# Patient Record
Sex: Male | Born: 2000 | Race: Black or African American | Marital: Single | Smoking: Never smoker
Health system: Southern US, Community
[De-identification: ages and names within clinical notes are randomized; demographics above are authoritative.]

---

## 2021-09-19 ENCOUNTER — Ambulatory Visit (INDEPENDENT_AMBULATORY_CARE_PROVIDER_SITE_OTHER): Payer: BC Managed Care – PPO | Admitting: Orthopaedic Surgery

## 2021-09-19 ENCOUNTER — Other Ambulatory Visit: Payer: Self-pay

## 2021-09-19 ENCOUNTER — Ambulatory Visit (HOSPITAL_BASED_OUTPATIENT_CLINIC_OR_DEPARTMENT_OTHER)
Admission: RE | Admit: 2021-09-19 | Discharge: 2021-09-19 | Disposition: A | Discharge status: Home / Self Care | Payer: BLUE CROSS/BLUE SHIELD | Source: Ambulatory Visit | Attending: Orthopaedic Surgery | Admitting: Orthopaedic Surgery

## 2021-09-19 ENCOUNTER — Other Ambulatory Visit (HOSPITAL_BASED_OUTPATIENT_CLINIC_OR_DEPARTMENT_OTHER): Payer: Self-pay | Admitting: Orthopaedic Surgery

## 2021-09-19 DIAGNOSIS — M25572 Pain in left ankle and joints of left foot: Secondary | ICD-10-CM | POA: Insufficient documentation

## 2021-09-19 DIAGNOSIS — M25872 Other specified joint disorders, left ankle and foot: Secondary | ICD-10-CM

## 2021-09-19 IMAGING — DX DG ANKLE COMPLETE 3+V*L*
3 series · 3 of 3 positions shown · non-contrast
Comparison: None.

CLINICAL DATA: Pain. Left ankle pain, patient reports history of
sprain.

EXAM:
LEFT ANKLE COMPLETE - 3+ VIEW

[ankle ap]
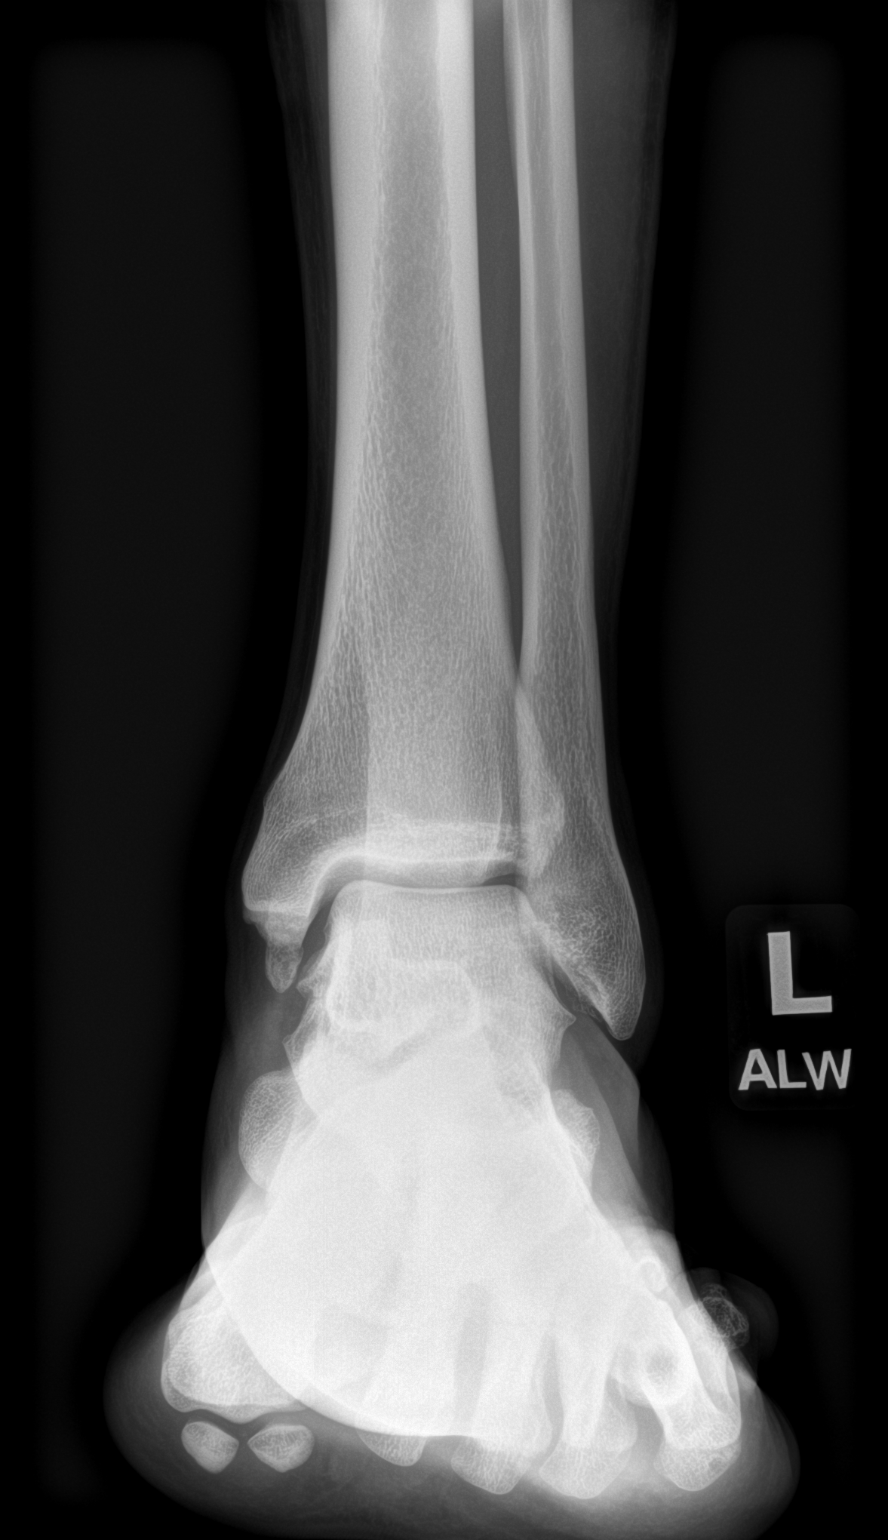

[ankle obl]
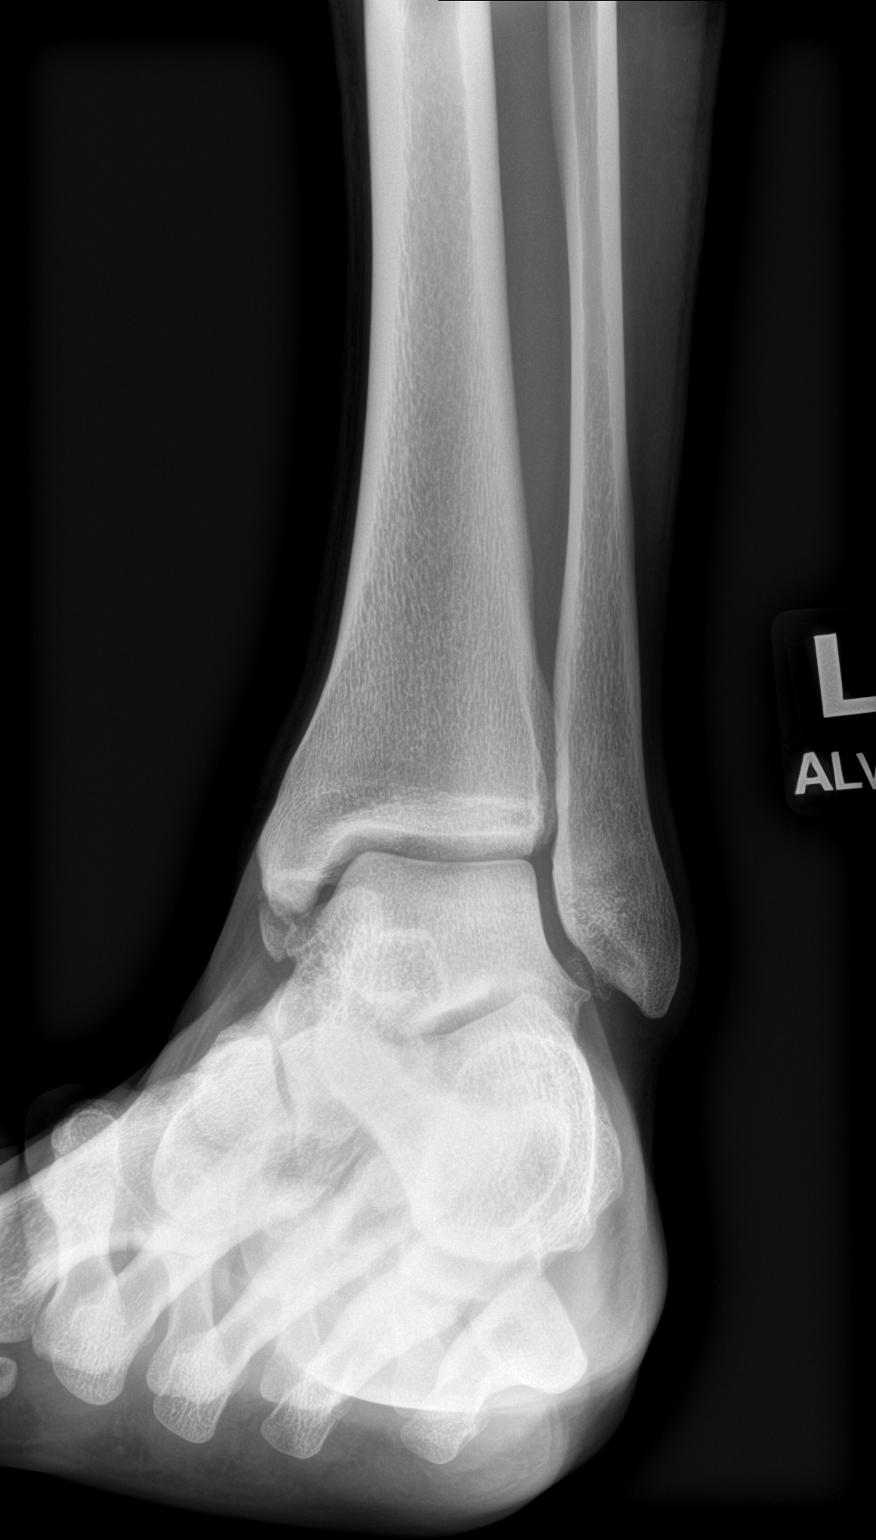

[ankle lat]
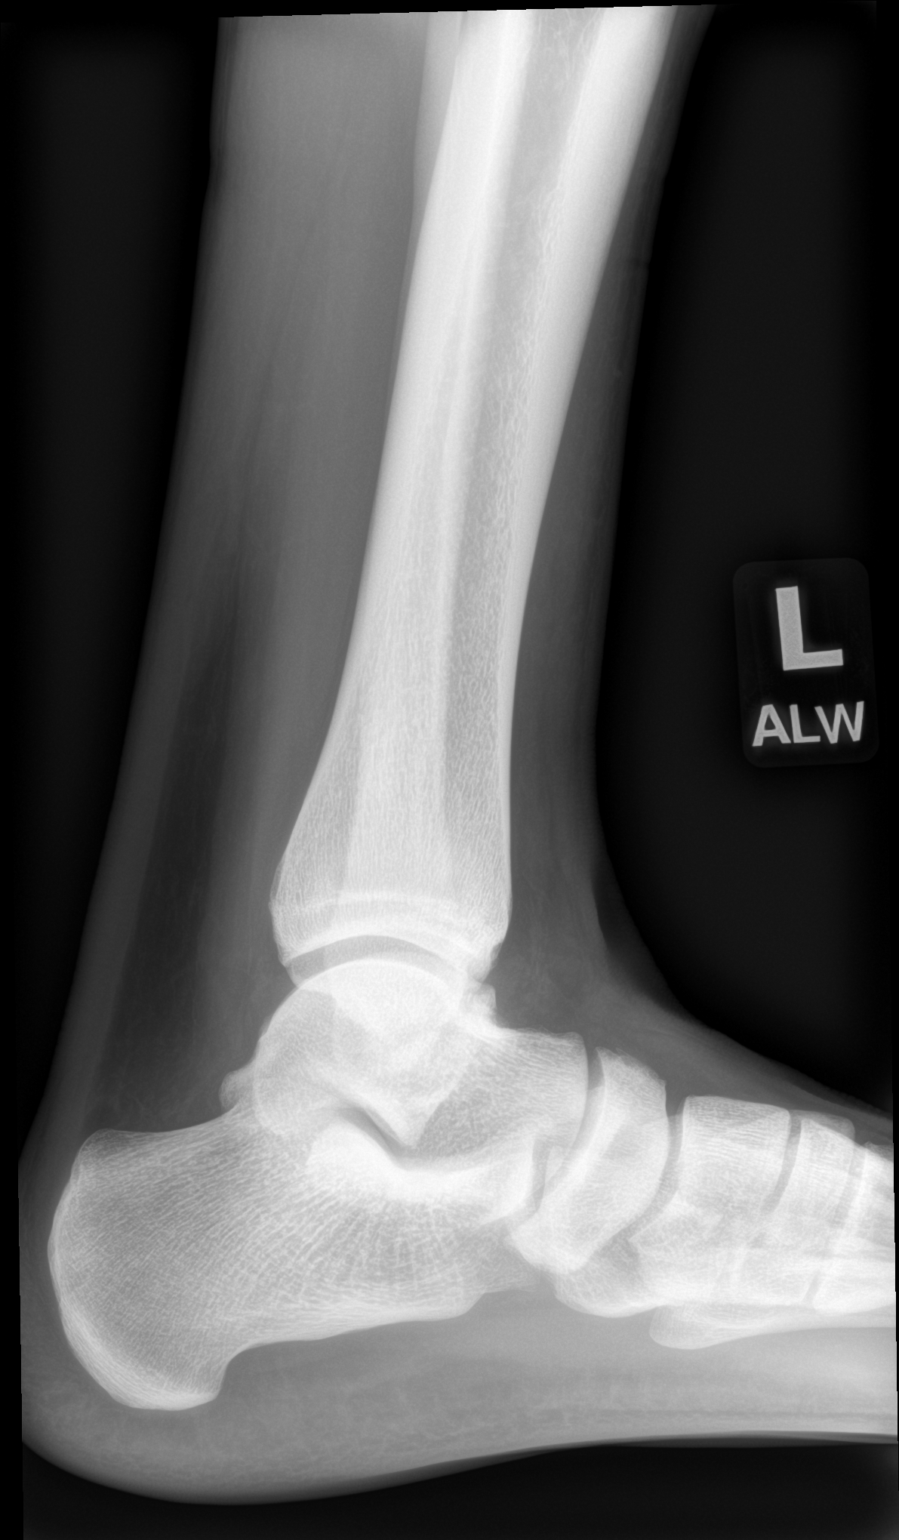

[3 of 3 positions shown; findings below may reference images not displayed]

FINDINGS: No acute fracture. Normal alignment. The ankle mortise is preserved.
There is a well corticated density distal to the medial malleolus
suggesting remote prior injury. Slight irregularity of the distal
fibular tip appears chronic and also suggest prior injury. There may
be a small ankle joint effusion. No erosion or bony destruction.
IMPRESSION: 1. Possible small ankle joint effusion.
2. Well corticated density distal to the medial malleolus and slight
irregularity of the distal fibular tip suggestive of remote prior
injuries.

## 2021-09-19 NOTE — Progress Notes (Signed)
Chief Complaint: left ankle pain     History of Present Illness:   Lee Kelley is a 20 y.o. male with left ankle pain and an inversion type injury in October 2022 during practice. 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.  Since that time he has had significant left ankle pain and significant swelling and pain with practices.  To this effect he has had somewhat limited and ineffective play time.  He has been taking ibuprofen as well as icing the ankle.  He continues to work on range of motion as well as stabilization exercises with his Event organiser.  He states that he is sprained left ankle many many times more than he can count.  Here today for further assessment and evaluation.    Surgical History:   None  PMH/PSH/Family History/Social History/Meds/Allergies:   No past medical history on file.  Social History   Socioeconomic History   Marital status: Single    Spouse name: Not on file   Number of children: Not on file   Years of education: Not on file   Highest education level: Not on file  Occupational History   Not on file  Tobacco Use   Smoking status: Not on file   Smokeless tobacco: Not on file  Substance and Sexual Activity   Alcohol use: Not on file   Drug use: Not on file   Sexual activity: Not on file  Other Topics Concern   Not on file  Social History Narrative   Not on file   Social Determinants of Health   Financial Resource Strain: Not on file  Food Insecurity: Not on file  Transportation Needs: Not on file  Physical Activity: Not on file  Stress: Not on file  Social Connections: Not on file   No family history on file. Not on File No current outpatient medications on file.   No current facility-administered medications for this visit.   No results found.  Review of Systems:   A ROS was performed including pertinent positives and negatives as documented in the HPI.  Physical  Exam :   Constitutional: NAD and appears stated age Neurological: Alert and oriented Psych: Appropriate affect and cooperative   There were no vitals taken for this visit.   Comprehensive Musculoskeletal Exam:     Right Left  Gait Normal  Musculoskeletal Exam    Deformity No deformity No deformity  Tenderness None Anterior lateral medial ankle mortise  Skin    Abrasions None None  Blisters None None  Range of Motion    Ankle Dorsiflexion 10 10 with pain  Ankle Plantarflexion 35 35  Subtalar Joint Inversion/Eversion Normal Increased laxity compared to contralateral with inversion  Stability    Dislocations None None  Subluxations or Laxity None None  Muscle Strength    Single Heel Raise Able Able  Sensation    Sural Nerve Dist. Normal Normal  Saphenous Nerve Dist. Normal Normal  Tibial Nerve Dist. Normal Normal  Deep Peroneal Nerve Dist. Normal Normal  Superficial Peroneal Nerve Dist. Normal Normal  Cardiovascular     Varicosites None  None  DP Artery Pulse Palpable Palpable  Capillary Refill <2 sec <2 sec  Special Tests:  Significant laxity with talar tilt as well as anterior drawer  Imaging:   Xray (3 views left ankle): He is got significant medial ossification consistent with possible old avulsion fracture.  Significant lateral talar impingement as well.  I personally reviewed and interpreted the radiographs.   Assessment:   20 year old male with left ankle impingement in the setting of recurrent ankle sprains.  He seems to be significantly flared up with regard to his pain after his most recent injury in October.  As result I would like to perform an ultrasound-guided left ankle steroid injection in order to see if we can get this to reacclimate to his baseline.  I would like him to take it easy for 2 days following ankle an injection in order to optimize efficacy.  Plan :    -Ultrasound-guided left ankle injection performed -I will follow-up with him in  the training room  I personally saw and evaluated the patient, and participated in the management and treatment plan.  Huel Cote, MD Attending Physician, Orthopedic Surgery  This document was dictated using Dragon voice recognition software. A reasonable attempt at proof reading has been made to minimize errors.

## 2021-09-25 ENCOUNTER — Encounter (HOSPITAL_BASED_OUTPATIENT_CLINIC_OR_DEPARTMENT_OTHER): Payer: Self-pay | Admitting: Orthopaedic Surgery

## 2021-09-25 NOTE — Progress Notes (Signed)
Chief Complaint: left ankle pain        History of Present Illness:    Lee Kelley  presents to training room today with continued left ankle pain. He got about 55% percent relief after the injection but this wore off midway through practice. Continues to have predominantly lateral gutter pain.      Assessment:   20 year old male with left ankle impingement in the setting of recurrent ankle sprains. I have created a lateral ankle post to help offload his lateral impingement. That being said, he continues to have pain and lacks ability to play significant time. I will continue to follow up and if he continues to experience impingement, we will consider arthroscopic debridement.  Surgical History:   None   PMH/PSH/Family History/Social History/Meds/Allergies:   No past medical history on file.   Social History         Socioeconomic History   Marital status: Single      Spouse name: Not on file   Number of children: Not on file   Years of education: Not on file   Highest education level: Not on file  Occupational History   Not on file  Tobacco Use   Smoking status: Not on file   Smokeless tobacco: Not on file  Substance and Sexual Activity   Alcohol use: Not on file   Drug use: Not on file   Sexual activity: Not on file  Other Topics Concern   Not on file  Social History Narrative   Not on file    Social Determinants of Health    Financial Resource Strain: Not on file  Food Insecurity: Not on file  Transportation Needs: Not on file  Physical Activity: Not on file  Stress: Not on file  Social Connections: Not on file    No family history on file. Not on File No current outpatient medications on file.    No current facility-administered medications for this visit.    Imaging Results (Last 48 hours)  No results found.     Review of Systems:   A ROS was performed including pertinent positives and negatives as documented in the HPI.   Physical Exam :    Constitutional: NAD and appears stated age Neurological: Alert and oriented Psych: Appropriate affect and cooperative    There were no vitals taken for this visit.    Comprehensive Musculoskeletal Exam:       Right Left  Gait Normal  Musculoskeletal Exam      Deformity No deformity No deformity  Tenderness None Anterior lateral medial ankle mortise  Skin      Abrasions None None  Blisters None None  Range of Motion      Ankle Dorsiflexion 10 10 with pain  Ankle Plantarflexion 35 35  Subtalar Joint Inversion/Eversion Normal Increased laxity compared to contralateral with inversion  Stability      Dislocations None None  Subluxations or Laxity None None  Muscle Strength      Single Heel Raise Able Able  Sensation      Sural Nerve Dist. Normal Normal  Saphenous Nerve Dist. Normal Normal  Tibial Nerve Dist. Normal Normal  Deep Peroneal Nerve Dist. Normal Normal  Superficial Peroneal Nerve Dist. Normal Normal  Cardiovascular       Varicosites None  None  DP Artery Pulse Palpable Palpable  Capillary Refill <2 sec <2 sec  Special Tests:  Significant laxity with talar tilt as well as anterior drawer  Imaging:   Xray (3 views left ankle): He is got significant medial ossification consistent with possible old avulsion fracture.  Significant lateral talar impingement as well.   I personally reviewed and interpreted the radiographs.

## 2021-09-26 ENCOUNTER — Other Ambulatory Visit (HOSPITAL_BASED_OUTPATIENT_CLINIC_OR_DEPARTMENT_OTHER): Payer: Self-pay | Admitting: Orthopaedic Surgery

## 2021-09-26 DIAGNOSIS — M25572 Pain in left ankle and joints of left foot: Secondary | ICD-10-CM

## 2021-10-02 ENCOUNTER — Ambulatory Visit
Admission: RE | Admit: 2021-10-02 | Discharge: 2021-10-02 | Disposition: A | Discharge status: Home / Self Care | Payer: 59 | Source: Ambulatory Visit | Attending: Orthopaedic Surgery | Admitting: Orthopaedic Surgery

## 2021-10-02 ENCOUNTER — Other Ambulatory Visit: Payer: Self-pay

## 2021-10-02 DIAGNOSIS — M25572 Pain in left ankle and joints of left foot: Secondary | ICD-10-CM

## 2021-10-02 IMAGING — MR MR ANKLE*L* W/O CM
4 of 5 series · 13 of 40 positions shown · non-contrast
Comparison: Radiographs [DATE]

CLINICAL DATA: Offended echo playing basketball 6 weeks ago.
Persistent pain.

EXAM:
MRI OF THE LEFT ANKLE WITHOUT CONTRAST
TECHNIQUE: Multiplanar, multisequence MR imaging of the ankle was performed. No
intravenous contrast was administered.

[Series 3: PD fat-sat · axial · left · 3.0mm · 0.25mm/px · z∈[-72,+32]mm · 4 of 32 slices shown]
[im 1/32]
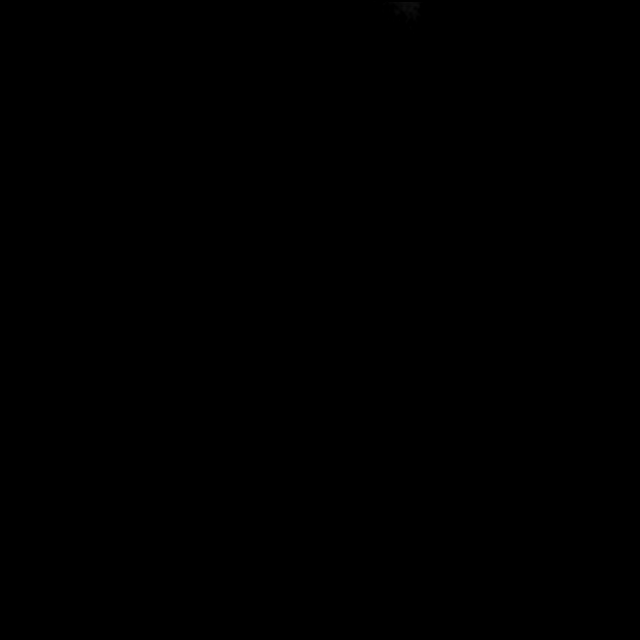
[im 5/32]
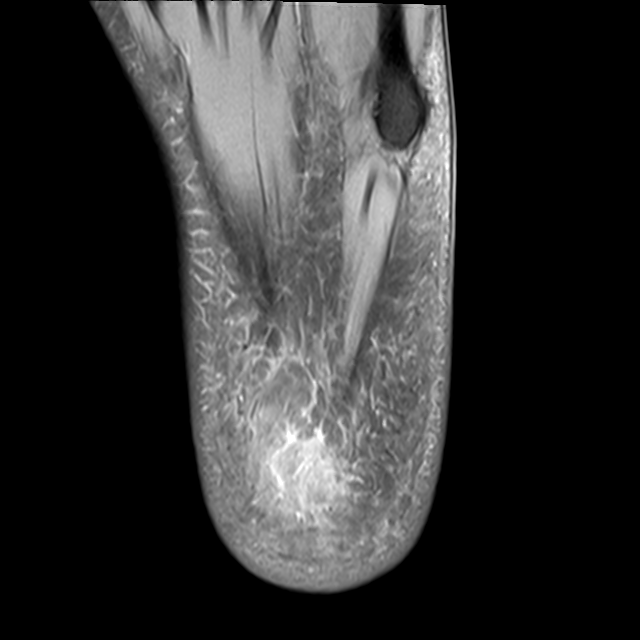
[im 18/32]
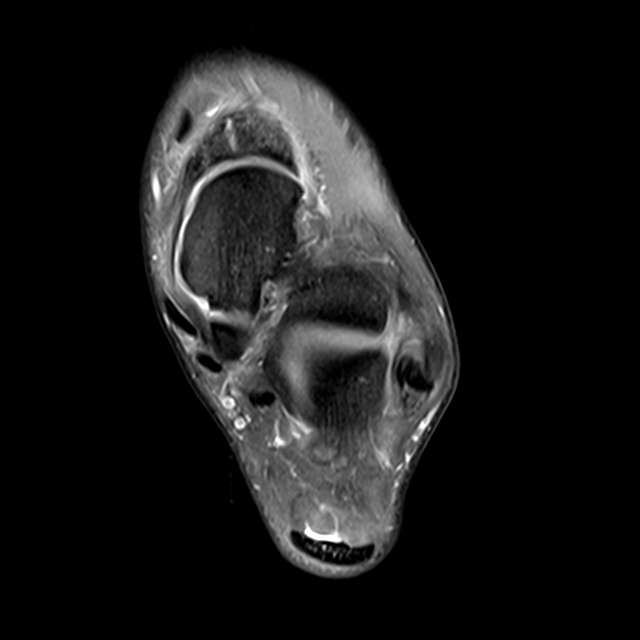
[im 27/32]
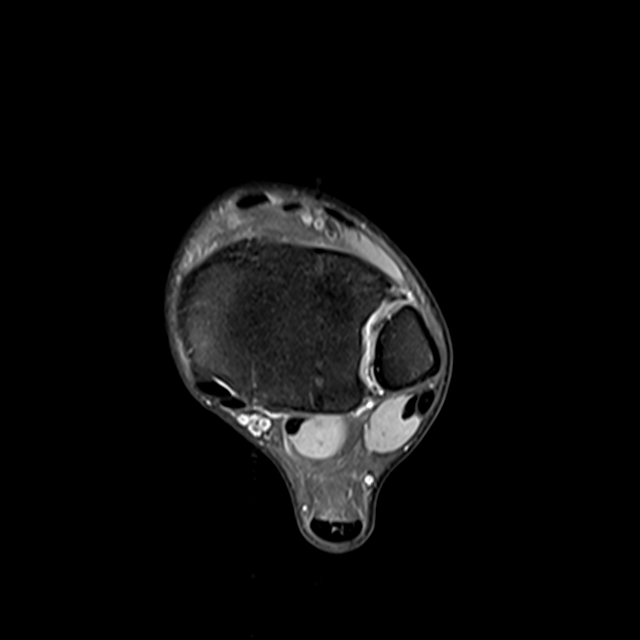

[Series 4: T2 fat-sat · axial · left · 3.0mm · 0.25mm/px · z∈[-60,+36]mm · 3 of 32 slices shown (1 of 2)]
[im 4/32]
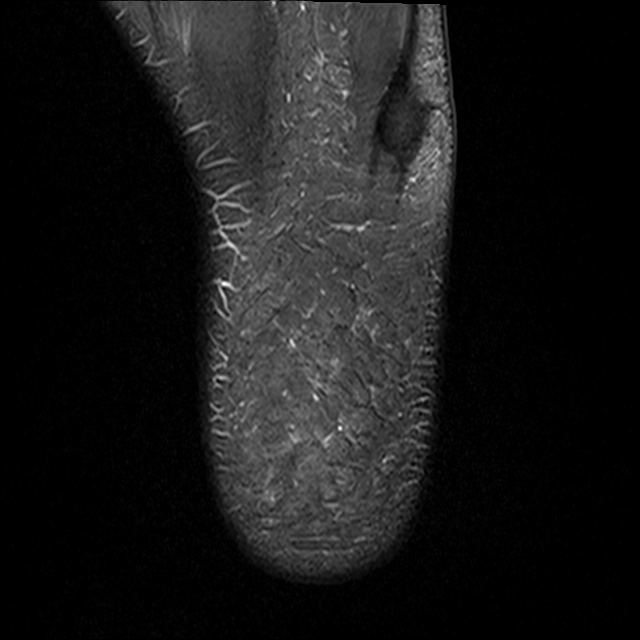
[im 16/32]
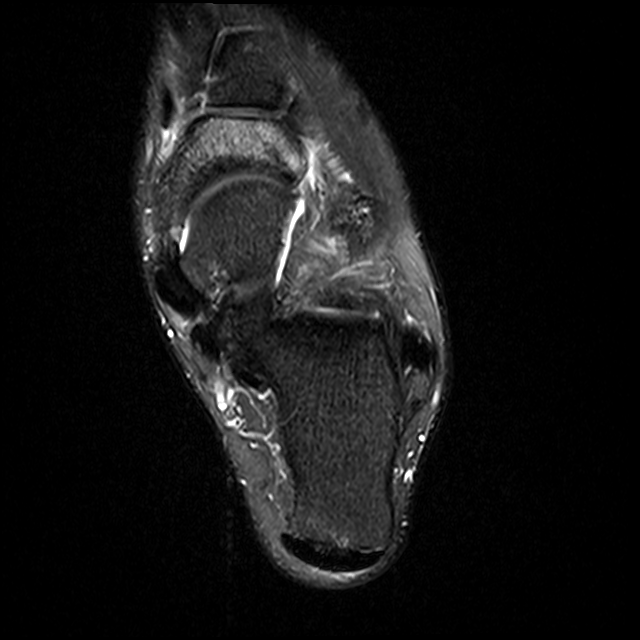
[im 28/32]
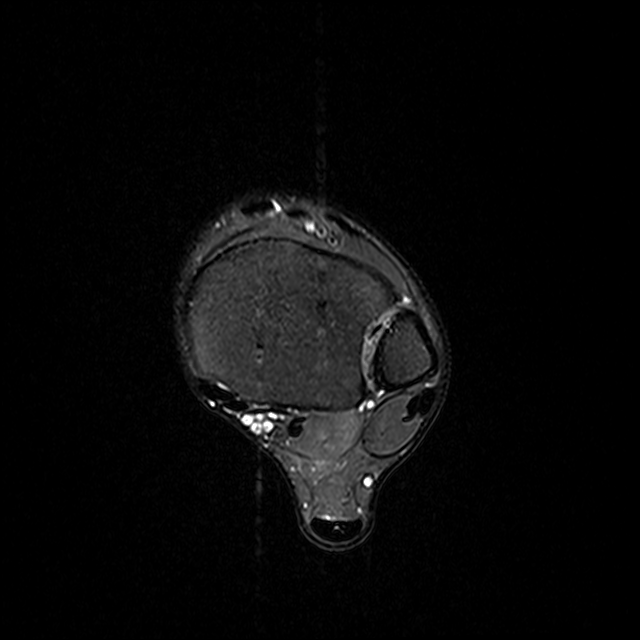

[Series 5: T1 · sagittal · left · 4.0mm · 0.27mm/px · 3 of 20 slices shown]
[im 4/20]
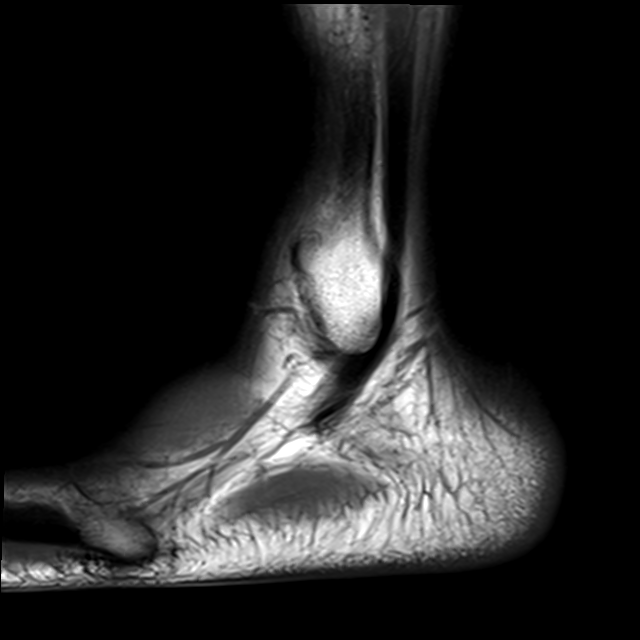
[im 12/20]
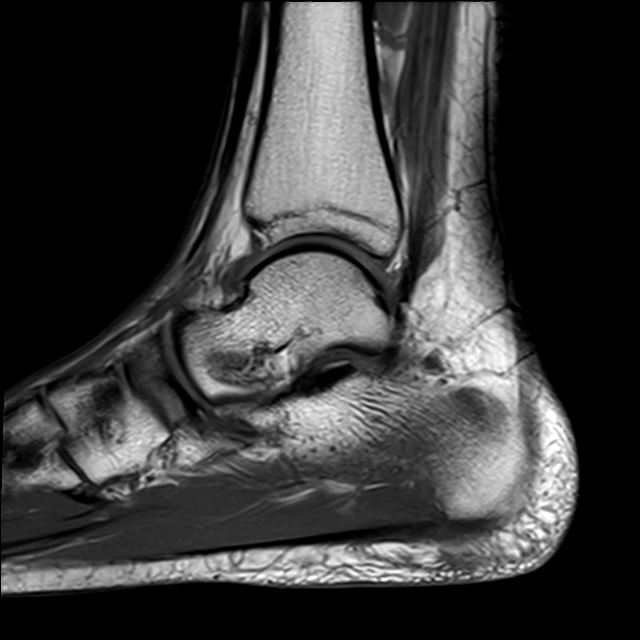
[im 20/20]
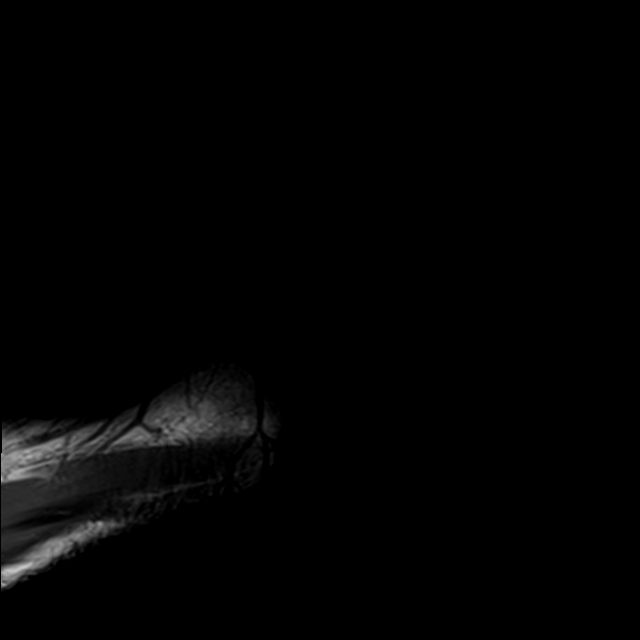

[Series 7: T2 fat-sat · coronal · left · 3.0mm · 0.25mm/px · 3 of 40 slices shown (2 of 2)]
[im 4/40]
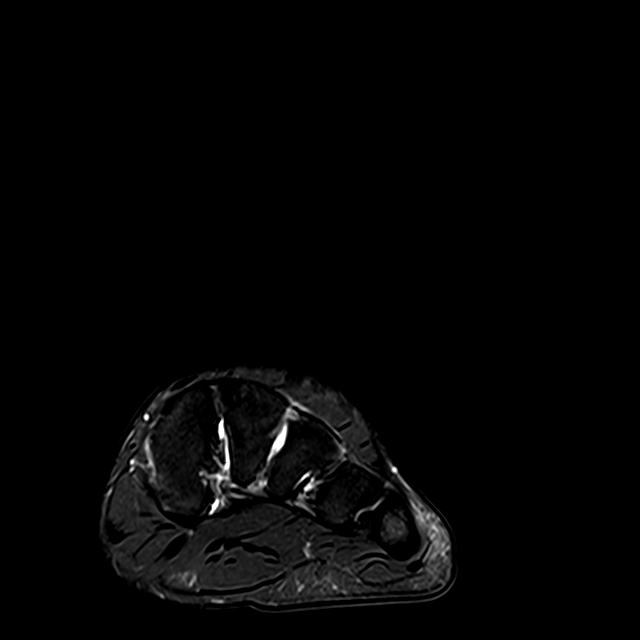
[im 20/40]
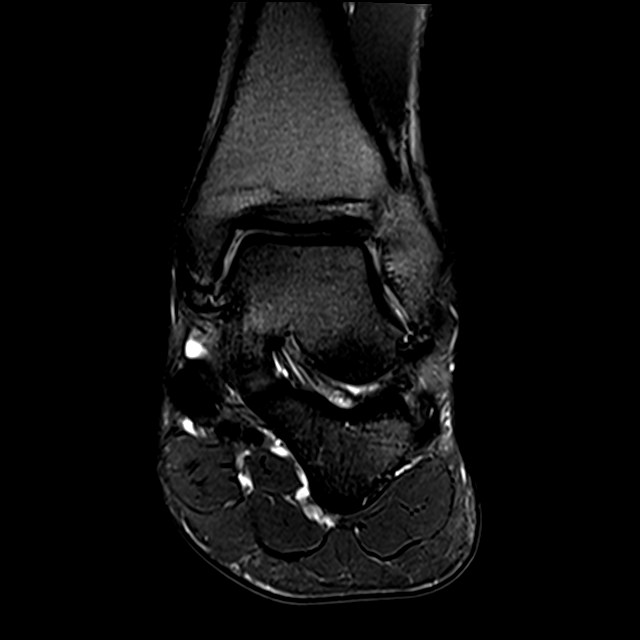
[im 36/40]
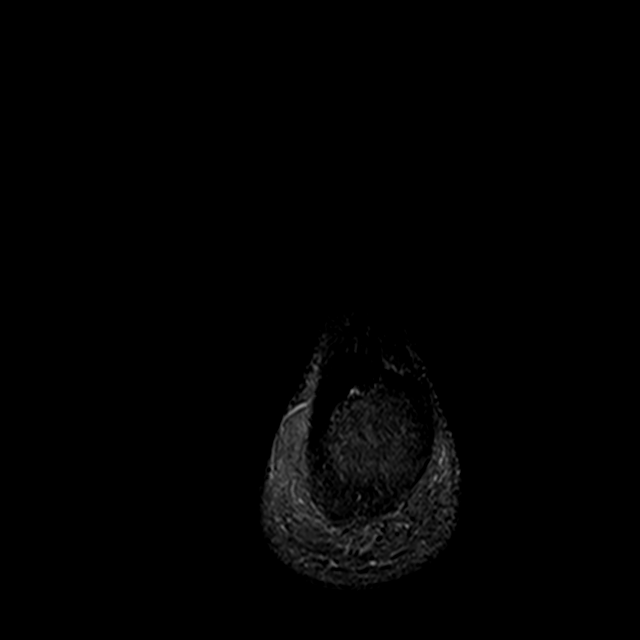

[13 of 40 positions shown; findings below may reference images not displayed]

FINDINGS: TENDONS

Peroneal: Intact

Posteromedial: Intact

Anterior: Intact

Achilles: Intact.  Mild retrocalcaneal bursitis.

Plantar Fascia: Intact

LIGAMENTS

Lateral: Intact

Medial: Intact

CARTILAGE

Ankle Joint: Normal articular cartilage. No cartilage defect or
osteochondral lesion. Small joint effusion.

Subtalar Joints/Sinus Tarsi: The subtalar joints are maintained.
Sinus tarsi is unremarkable. The cervical and interosseous ligaments
are intact. The spring ligament is intact.

Bones: There is a healing nondisplaced oblique coursing fracture of
the lateral aspect of the navicular bone. Moderate surrounding
marrow edema.

There is a remote avulsion type fracture involving the distal tip of
the medial malleolus.

Other: Unremarkable foot and ankle musculature.
IMPRESSION: 1. Healing nondisplaced oblique coursing fracture of the lateral
aspect of the navicular bone.
2. Intact medial and lateral ankle ligaments and tendons.
3. Small tibiotalar joint effusion.
4. Mild retrocalcaneal bursitis.

## 2021-10-04 ENCOUNTER — Ambulatory Visit (HOSPITAL_BASED_OUTPATIENT_CLINIC_OR_DEPARTMENT_OTHER): Payer: BC Managed Care – PPO | Admitting: Orthopaedic Surgery

## 2021-10-04 ENCOUNTER — Other Ambulatory Visit: Payer: Self-pay

## 2021-10-04 ENCOUNTER — Ambulatory Visit (INDEPENDENT_AMBULATORY_CARE_PROVIDER_SITE_OTHER): Payer: BC Managed Care – PPO | Admitting: Orthopaedic Surgery

## 2021-10-04 DIAGNOSIS — M84376A Stress fracture, unspecified foot, initial encounter for fracture: Secondary | ICD-10-CM | POA: Diagnosis not present

## 2021-10-04 NOTE — Progress Notes (Signed)
Chief Complaint: left ankle pain     History of Present Illness:   10/04/2021: Presents today with ongoing pain with weight bearing.  MRI was obtained due to his inability to return to basketball during the season which showed navicular stress fracture.  I have asked him to come in today so we can have further discussion about additional treatment.  Of note he does have a history of contralateral navicular stress injury which was treated with 6 weeks of nonweightbearing.  He returned well after this with no evolving issues.  Lee Kelley is a 20 y.o. male with left ankle pain and an inversion type injury in October 2022 during practice. 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.  Since that time he has had significant left ankle pain and significant swelling and pain with practices.  To this effect he has had somewhat limited and ineffective play time.  He has been taking ibuprofen as well as icing the ankle.  He continues to work on range of motion as well as stabilization exercises with his Event organiser.  He states that he is sprained left ankle many many times more than he can count.  Here today for further assessment and evaluation.    Surgical History:   None  PMH/PSH/Family History/Social History/Meds/Allergies:   No past medical history on file.  Social History   Socioeconomic History   Marital status: Single    Spouse name: Not on file   Number of children: Not on file   Years of education: Not on file   Highest education level: Not on file  Occupational History   Not on file  Tobacco Use   Smoking status: Not on file   Smokeless tobacco: Not on file  Substance and Sexual Activity   Alcohol use: Not on file   Drug use: Not on file   Sexual activity: Not on file  Other Topics Concern   Not on file  Social History Narrative   Not on file   Social Determinants of Health   Financial Resource Strain: Not  on file  Food Insecurity: Not on file  Transportation Needs: Not on file  Physical Activity: Not on file  Stress: Not on file  Social Connections: Not on file   No family history on file. Not on File No current outpatient medications on file.   No current facility-administered medications for this visit.   MR ANKLE LEFT WO CONTRAST  Result Date: 10/03/2021 CLINICAL DATA:  Offended echo playing basketball 6 weeks ago. Persistent pain. EXAM: MRI OF THE LEFT ANKLE WITHOUT CONTRAST TECHNIQUE: Multiplanar, multisequence MR imaging of the ankle was performed. No intravenous contrast was administered. COMPARISON:  Radiographs 09/19/2021 FINDINGS: TENDONS Peroneal: Intact Posteromedial: Intact Anterior: Intact Achilles: Intact.  Mild retrocalcaneal bursitis. Plantar Fascia: Intact LIGAMENTS Lateral: Intact Medial: Intact CARTILAGE Ankle Joint: Normal articular cartilage. No cartilage defect or osteochondral lesion. Small joint effusion. Subtalar Joints/Sinus Tarsi: The subtalar joints are maintained. Sinus tarsi is unremarkable. The cervical and interosseous ligaments are intact. The spring ligament is intact. Bones: There is a healing nondisplaced oblique coursing fracture of the lateral aspect of the navicular bone. Moderate surrounding marrow edema. There is a remote avulsion type fracture involving the distal tip of the medial malleolus. Other: Unremarkable foot and ankle musculature.  IMPRESSION: 1. Healing nondisplaced oblique coursing fracture of the lateral aspect of the navicular bone. 2. Intact medial and lateral ankle ligaments and tendons. 3. Small tibiotalar joint effusion. 4. Mild retrocalcaneal bursitis. Electronically Signed   By: Rudie Meyer M.D.   On: 10/03/2021 08:38    Review of Systems:   A ROS was performed including pertinent positives and negatives as documented in the HPI.  Physical Exam :   Constitutional: NAD and appears stated age Neurological: Alert and oriented Psych:  Appropriate affect and cooperative   There were no vitals taken for this visit.   Comprehensive Musculoskeletal Exam:     Right Left  Gait Normal  Musculoskeletal Exam    Deformity No deformity No deformity  Tenderness None Anterior lateral medial ankle mortise  Skin    Abrasions None None  Blisters None None  Range of Motion    Ankle Dorsiflexion 10 10  Ankle Plantarflexion 35 35  Subtalar Joint Inversion/Eversion Normal Increased laxity compared to contralateral with inversion  Stability    Dislocations None None  Subluxations or Laxity None None  Muscle Strength    Single Heel Raise Able Able  Sensation    Sural Nerve Dist. Normal Normal  Saphenous Nerve Dist. Normal Normal  Tibial Nerve Dist. Normal Normal  Deep Peroneal Nerve Dist. Normal Normal  Superficial Peroneal Nerve Dist. Normal Normal  Cardiovascular     Varicosites None  None  DP Artery Pulse Palpable Palpable  Capillary Refill <2 sec <2 sec  Special Tests:  Significant laxity with talar tilt as well as anterior drawer     Imaging:   Xray (3 views left ankle): He has significant medial ossification consistent with possible old avulsion fracture.  Significant lateral talar impingement as well.  MRI left ankle: There is a nondisplaced left navicular fracture with ongoing healing.  I personally reviewed and interpreted the radiographs.   Assessment:   20 year old male with left ankle impingement in the setting of recurrent ankle sprains.  MRI was obtained which shows evidence of a navicular stress fracture.  I have explained that is likely that he is experiencing more pain on the outside of the foot at this time as he is likely favoring the foot to not put stress through the navicular.  This is likely resulted in compensatory lateral based ankle pain.  I would like to keep him nonweightbearing for 4 additional weeks in a cam boot.  He will be given a boot and crutches at training room.  In order to  encourage healing I have advised that ultimately I would like to get a bone stimulator and we have contacted the rep to this effect.  We will plan to send him for blood work so that we can assess his vitamin D level and we will work closely with his sports nutritionist so as to optimize his vitamin D level.  In the meantime he will double his vitamin D supplementation. Plan :    -I will continue to follow him in training room and see him in 4 weeks for repeat CT scan in preparation for returning him back to play.  I personally saw and evaluated the patient, and participated in the management and treatment plan.  Huel Cote, MD Attending Physician, Orthopedic Surgery  This document was dictated using Dragon voice recognition software. A reasonable attempt at proof reading has been made to minimize errors.

## 2021-10-12 ENCOUNTER — Other Ambulatory Visit (HOSPITAL_BASED_OUTPATIENT_CLINIC_OR_DEPARTMENT_OTHER): Payer: Self-pay | Admitting: Orthopaedic Surgery

## 2021-10-12 MED ORDER — VITAMIN D (ERGOCALCIFEROL) 1.25 MG (50000 UNIT) PO CAPS: 50000.0000 [IU] | ORAL_CAPSULE | ORAL | 0 refills | Status: DC

## 2021-10-12 MED ORDER — CALCIUM CARBONATE 1500 (600 CA) MG PO TABS: 1500.0000 mg | ORAL_TABLET | Freq: Every day | ORAL | 0 refills | Status: DC

## 2021-11-03 ENCOUNTER — Ambulatory Visit (INDEPENDENT_AMBULATORY_CARE_PROVIDER_SITE_OTHER): Payer: 59 | Admitting: Podiatry

## 2021-11-03 ENCOUNTER — Ambulatory Visit: Payer: Self-pay

## 2021-11-03 DIAGNOSIS — S92255S Nondisplaced fracture of navicular [scaphoid] of left foot, sequela: Secondary | ICD-10-CM

## 2021-11-03 DIAGNOSIS — M216X1 Other acquired deformities of right foot: Secondary | ICD-10-CM

## 2021-11-03 DIAGNOSIS — Z87312 Personal history of (healed) stress fracture: Secondary | ICD-10-CM | POA: Diagnosis not present

## 2021-11-03 DIAGNOSIS — M216X2 Other acquired deformities of left foot: Secondary | ICD-10-CM | POA: Diagnosis not present

## 2021-11-03 DIAGNOSIS — S92254S Nondisplaced fracture of navicular [scaphoid] of right foot, sequela: Secondary | ICD-10-CM

## 2021-11-07 NOTE — Progress Notes (Signed)
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 Closed nondisplaced fracture of navicular bone of left foot, sequela  S92.255S     2. Closed nondisplaced fracture of navicular bone of right foot, sequela  S92.254S       Current or Previous Devices: Prefab devices as make-do  Foot Examination: Skin presentation:   Intact Ulcers & Callousing:   None and no history Toe / Foot Deformities:  None Weight Bearing Presentation:  Rectus Sensation:    Intact  ORTHOTIC RECOMMENDATION Recommended Device: 1x pair of custom functional foot orthotics  GOALS OF ORTHOSES - Reduce Pain - Prevent Foot Deformity - Prevent Progression of Further Foot Deformity - Relieve Pressure - Improve the Overall Biomechanical Function of the Foot and Lower Extremity.  ACTIONS PERFORMED Patient was casted for Foot Orthoses via crush box. Procedure was explained and patient tolerated procedure well. All questions were answered and concerns addressed.  PLAN Potential out of pocket cost was communicated to patient. Casts are to be sent to [facility] for fabrication. Patient is to be called for fitting when devices are ready.

## 2021-11-08 NOTE — Progress Notes (Signed)
Subjective:   Patient ID: Lee Kelley, male   DOB: 20 y.o.   MRN: [id number]   HPI 20 year old male presents the office today for orthotics.  He is accompanied today by his athletic trainer from [place].  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  He has been on the treatment of orthopedics for this and was referred to [place] for orthotics.   Review of Systems  All other systems reviewed and are negative.  No past medical history on file.     Current Outpatient Medications:    calcium carbonate (OSCAL) 1500 (600 Ca) MG TABS tablet, Take 1 tablet (1,500 mg total) by mouth daily with breakfast., Disp: 30 tablet, Rfl: 0   Vitamin D, Ergocalciferol, (DRISDOL) 1.25 MG (50000 UNIT) CAPS capsule, Take 1 capsule (50,000 Units total) by mouth every 7 (seven) days., Disp: 5 capsule, Rfl: 0  Not on File        Objective:  Physical Exam  General: AAO x3, NAD  Dermatological: Skin is warm, dry and supple bilateral.  There are no open sores, no preulcerative lesions, no rash or signs of infection present.  Vascular: Dorsalis Pedis artery and Posterior Tibial artery pedal pulses are 2/4 bilateral with immedate capillary fill time.  There is no pain with calf compression, swelling, warmth, erythema.   Neruologic: Grossly intact via light touch bilateral.   Musculoskeletal: Cannot elicit any area of pinpoint tenderness.  Decreased medial arch upon weightbearing.  Ankle, subtalar joint range of motion intact but any restrictions.  Muscular strength 5/5 in all groups tested bilateral.  Gait: Unassisted, Nonantalgic.       Assessment:   Flatfoot, history of navicular stress fractures     Plan:  He is already pending treatment with orthopedics as well as his athletic trainer for his foot issues.  He presents today for orthotics and he was measured for inserts with our orthotist, Gates Rigg for this.  I will defer other treatment to orthopedics at  this point.  If needed otherwise let us know.  We will plan on beginning therapy orthotics for his basketball shoes.  If needed we can make him an orthotic for his everyday shoes as well.  As a courtesy there is no cost for orthotics today.

## 2021-11-14 ENCOUNTER — Ambulatory Visit (INDEPENDENT_AMBULATORY_CARE_PROVIDER_SITE_OTHER): Payer: No Typology Code available for payment source | Admitting: Orthopaedic Surgery

## 2021-11-14 ENCOUNTER — Other Ambulatory Visit: Payer: Self-pay

## 2021-11-14 DIAGNOSIS — M25572 Pain in left ankle and joints of left foot: Secondary | ICD-10-CM

## 2021-11-14 DIAGNOSIS — M84376A Stress fracture, unspecified foot, initial encounter for fracture: Secondary | ICD-10-CM

## 2021-11-14 NOTE — Addendum Note (Signed)
Addended by: Kerby Less A on: 11/14/2021 04:38 PM   Modules accepted: Orders

## 2021-11-14 NOTE — Progress Notes (Signed)
Chief Complaint: left ankle pain     History of Present Illness:   11/14/2021: Presents today for follow-up of the left foot navicular stress reaction.  He has undergone a period of 5 weeks of nonweightbearing in a cam boot.  That being said when he returns to practice he did experience pain and swelling on the outside and inner part of the ankle.  Similar to prior to his.  Nonweightbearing  Lee Kelley is a 21 y.o. male with left ankle pain and an inversion type injury in October 2022 during practice. 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.  Since that time he has had significant left ankle pain and significant swelling and pain with practices.  To this effect he has had somewhat limited and ineffective play time.  He has been taking ibuprofen as well as icing the ankle.  He continues to work on range of motion as well as stabilization exercises with his Event organiser.  He states that he is sprained left ankle many many times more than he can count.  Here today for further assessment and evaluation.    Surgical History:   None  PMH/PSH/Family History/Social History/Meds/Allergies:   No past medical history on file.  Social History   Socioeconomic History   Marital status: Single    Spouse name: Not on file   Number of children: Not on file   Years of education: Not on file   Highest education level: Not on file  Occupational History   Not on file  Tobacco Use   Smoking status: Not on file   Smokeless tobacco: Not on file  Substance and Sexual Activity   Alcohol use: Not on file   Drug use: Not on file   Sexual activity: Not on file  Other Topics Concern   Not on file  Social History Narrative   Not on file   Social Determinants of Health   Financial Resource Strain: Not on file  Food Insecurity: Not on file  Transportation Needs: Not on file  Physical Activity: Not on file  Stress: Not on file  Social  Connections: Not on file   No family history on file. Not on File Current Outpatient Medications  Medication Sig Dispense Refill   calcium carbonate (OSCAL) 1500 (600 Ca) MG TABS tablet Take 1 tablet (1,500 mg total) by mouth daily with breakfast. 30 tablet 0   Vitamin D, Ergocalciferol, (DRISDOL) 1.25 MG (50000 UNIT) CAPS capsule Take 1 capsule (50,000 Units total) by mouth every 7 (seven) days. 5 capsule 0   No current facility-administered medications for this visit.   No results found.  Review of Systems:   A ROS was performed including pertinent positives and negatives as documented in the HPI.  Physical Exam :   Constitutional: NAD and appears stated age Neurological: Alert and oriented Psych: Appropriate affect and cooperative   There were no vitals taken for this visit.   Comprehensive Musculoskeletal Exam:     Right Left  Gait Normal  Musculoskeletal Exam    Deformity No deformity No deformity  Tenderness None Medial navicular  Skin    Abrasions None None  Blisters None None  Range of Motion    Ankle Dorsiflexion 10 10  Ankle Plantarflexion 35 35  Subtalar Joint Inversion/Eversion  Normal Increased laxity compared to contralateral with inversion  Stability    Dislocations None None  Subluxations or Laxity None None  Muscle Strength    Single Heel Raise Able Able  Sensation    Sural Nerve Dist. Normal Normal  Saphenous Nerve Dist. Normal Normal  Tibial Nerve Dist. Normal Normal  Deep Peroneal Nerve Dist. Normal Normal  Superficial Peroneal Nerve Dist. Normal Normal  Cardiovascular     Varicosites None  None  DP Artery Pulse Palpable Palpable  Capillary Refill <2 sec <2 sec  Special Tests:  Significant laxity with talar tilt as well as anterior drawer     Imaging:   Xray (3 views left ankle): He has significant medial ossification consistent with possible old avulsion fracture.  Significant lateral talar impingement as well.  MRI left ankle: There  is a nondisplaced left navicular fracture with ongoing healing.  I personally reviewed and interpreted the radiographs.   Assessment:   21 year old male with left ankle impingement in the setting of recurrent ankle sprains.  MRI was obtained which shows evidence of a navicular stress fracture.  He has been undergoing nonweightbearing as well as vitamin D, calcium supplementation as well as a bone stimulator.  He has returned to basketball this time at practice but is only been able to participate in 65 to 70% level.  As result I discussed that I would like him to undergo an additional 2 weeks of nonweightbearing in the cam boot.  I will plan to order an MRI so we can assess for healing of his navicular stress reaction.  We did also discussed the implications of his lateral ankle instability and impingement.  This continues to bother him.  I described that even with navicular healing is unlikely that this pain on the outside of the ankle fully improved.  I did discuss the role for possibly undergoing ankle debridement as well as Brostrm repair although he would like to try to return for the season pending healing on the MRI Plan :    -Plan for MRI of left foot and ankle to look for navicular healing  I personally saw and evaluated the patient, and participated in the management and treatment plan.  Lee Cote, MD Attending Physician, Orthopedic Surgery  This document was dictated using Dragon voice recognition software. A reasonable attempt at proof reading has been made to minimize errors.

## 2021-11-23 ENCOUNTER — Other Ambulatory Visit: Payer: Self-pay

## 2021-11-23 ENCOUNTER — Ambulatory Visit
Admission: RE | Admit: 2021-11-23 | Discharge: 2021-11-23 | Disposition: A | Discharge status: Home / Self Care | Payer: 59 | Source: Ambulatory Visit | Attending: Orthopaedic Surgery | Admitting: Orthopaedic Surgery

## 2021-11-23 ENCOUNTER — Ambulatory Visit
Admission: RE | Admit: 2021-11-23 | Discharge: 2021-11-23 | Disposition: A | Discharge status: Home / Self Care | Payer: No Typology Code available for payment source | Source: Ambulatory Visit | Attending: Orthopaedic Surgery | Admitting: Orthopaedic Surgery

## 2021-11-23 DIAGNOSIS — M84376A Stress fracture, unspecified foot, initial encounter for fracture: Secondary | ICD-10-CM

## 2021-11-23 IMAGING — MR MR FOOT*L* W/O CM
5 series · 40 of 40 positions shown · non-contrast
Comparison: None.

CLINICAL DATA: Foot pain, stress fracture suspected. Negative x-ray

EXAM:
MRI OF THE LEFT FOOT WITHOUT CONTRAST
TECHNIQUE: Multiplanar, multisequence MR imaging of the left was performed. No
intravenous contrast was administered.

[Series 5: T1 · coronal · left · 3.0mm · 0.31mm/px · 12 of 46 slices shown (1 of 2)]
[im 1/46]
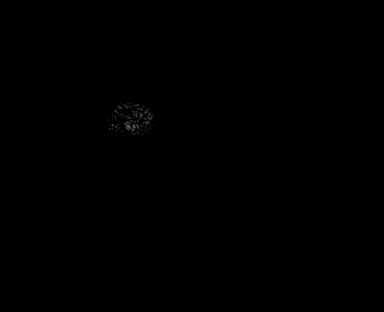
[im 5/46]
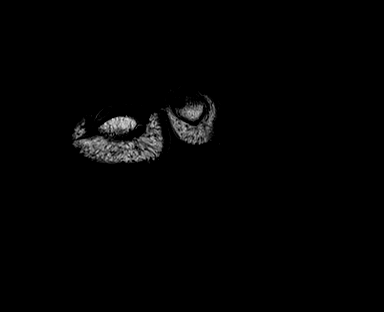
[im 9/46]
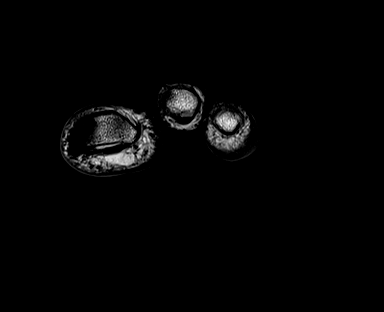
[im 13/46]
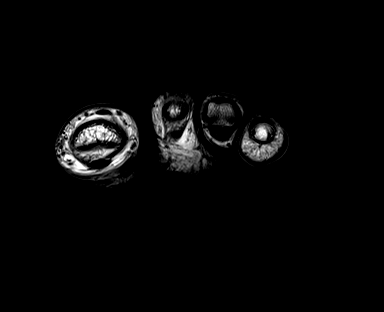
[im 17/46]
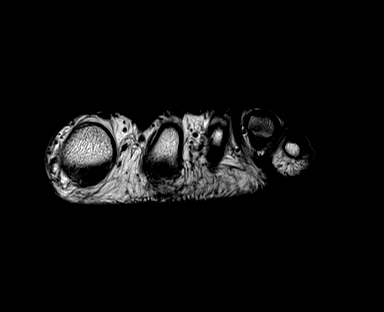
[im 21/46]
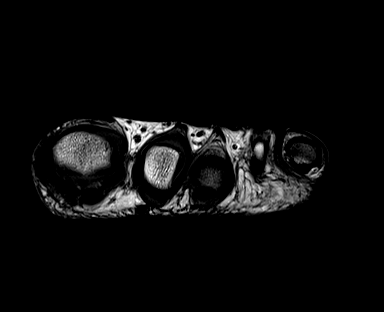
[im 25/46]
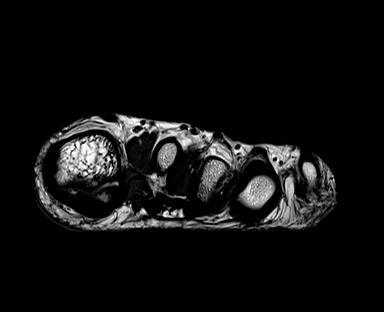
[im 29/46]
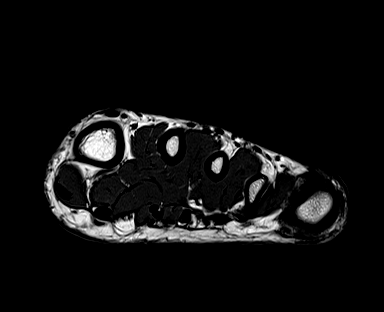
[im 33/46]
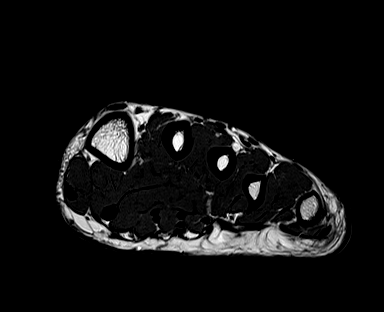
[im 37/46]
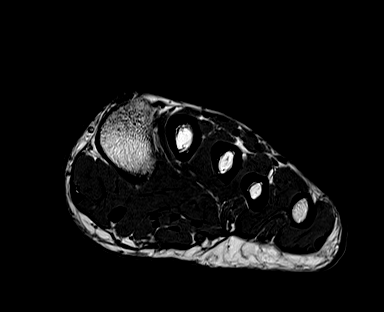
[im 41/46]
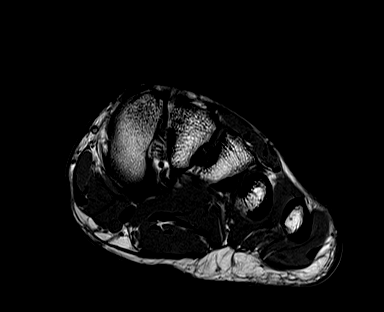
[im 46/46]
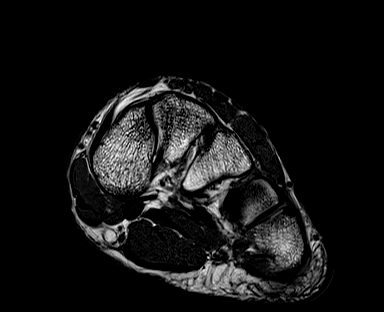

[Series 6: T2 fat-sat · coronal · left · 3.0mm · 0.31mm/px · 11 of 46 slices shown (1 of 2)]
[im 1/46]
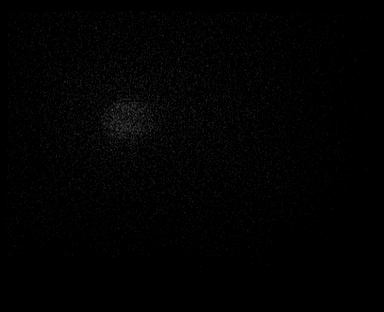
[im 5/46]
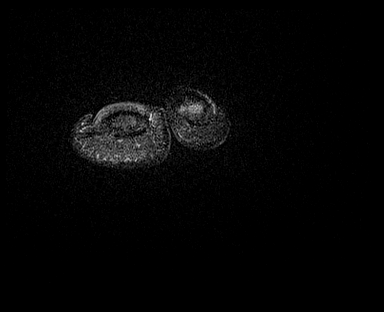
[im 10/46]
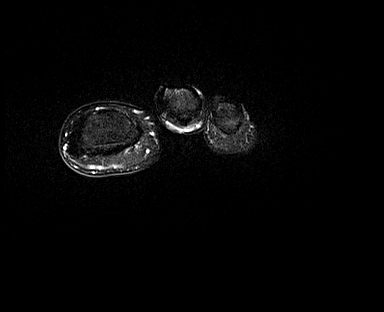
[im 14/46]
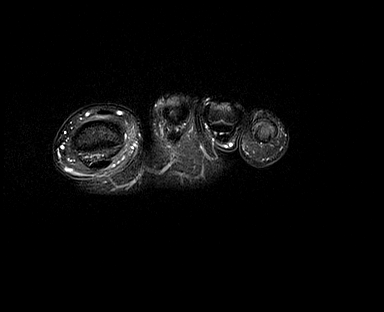
[im 19/46]
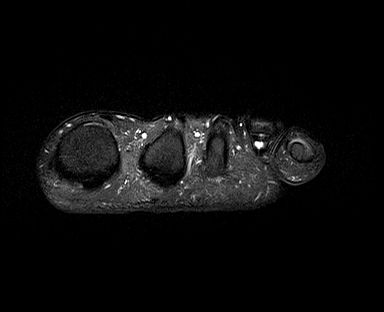
[im 23/46]
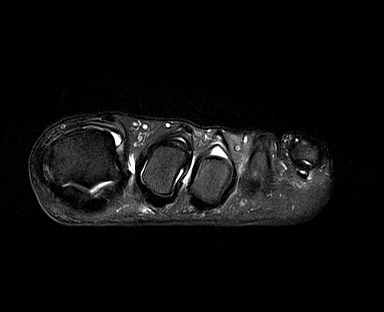
[im 28/46]
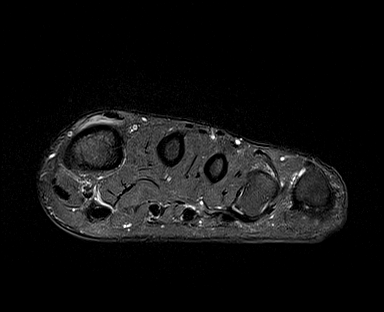
[im 32/46]
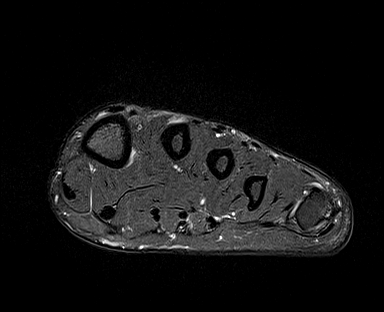
[im 37/46]
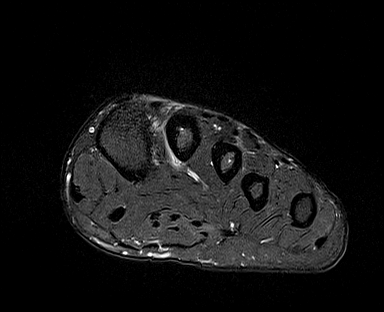
[im 41/46]
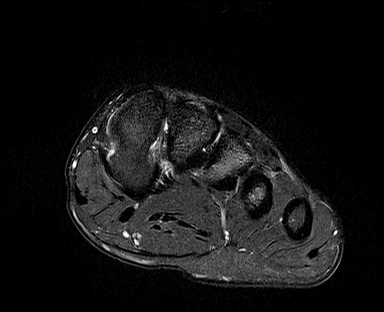
[im 46/46]
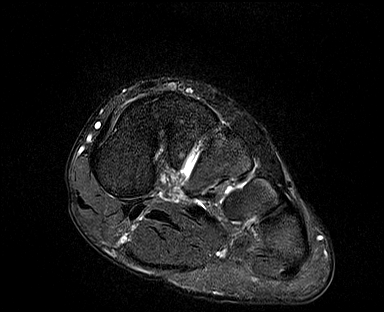

[Series 7: STIR · sagittal · left · 3.0mm · 0.70mm/px · 7 of 31 slices shown]
[im 1/31]
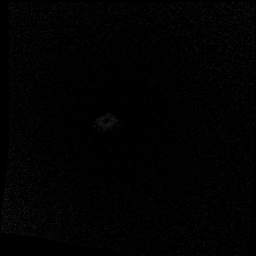
[im 6/31]
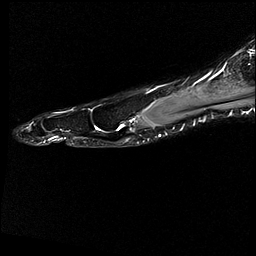
[im 11/31]
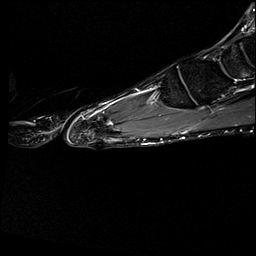
[im 16/31]
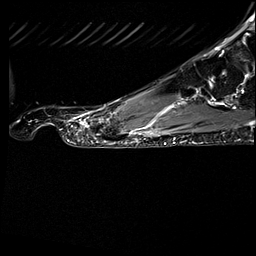
[im 21/31]
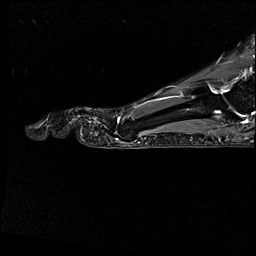
[im 26/31]
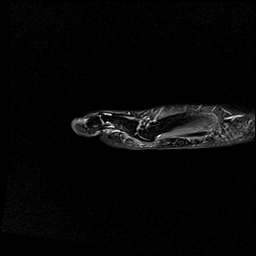
[im 31/31]
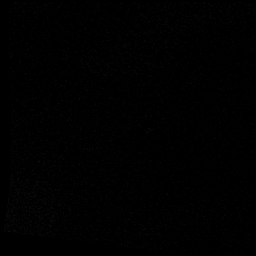

[Series 10: T1 · axial · left · 3.0mm · 0.49mm/px · z∈[-141,-59]mm · 5 of 23 slices shown (2 of 2)]
[im 1/23]
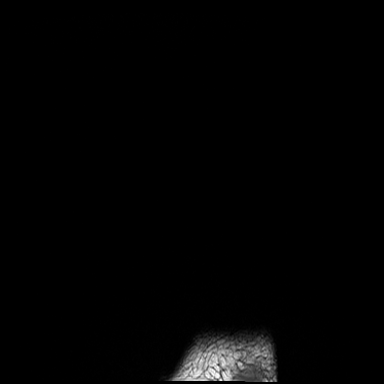
[im 6/23]
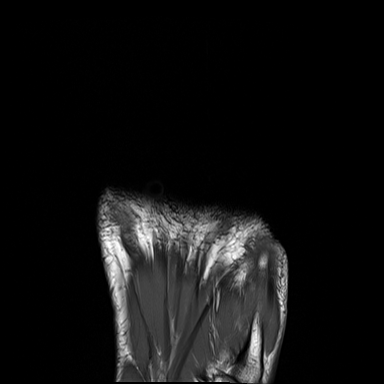
[im 12/23]
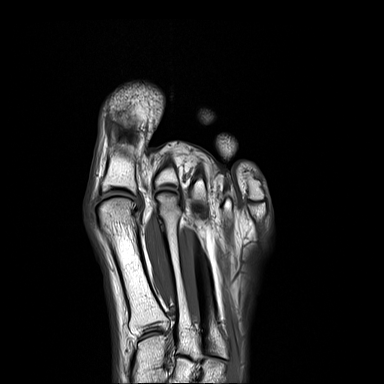
[im 17/23]
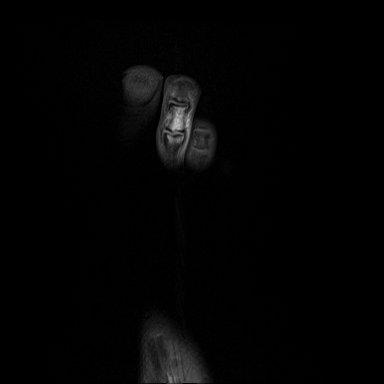
[im 23/23]
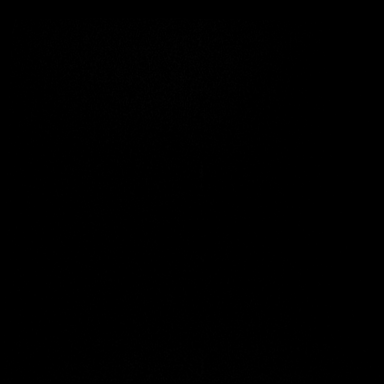

[Series 11: T2 fat-sat · axial · left · 3.0mm · 0.49mm/px · z∈[-141,-59]mm · 5 of 23 slices shown (2 of 2)]
[im 1/23]
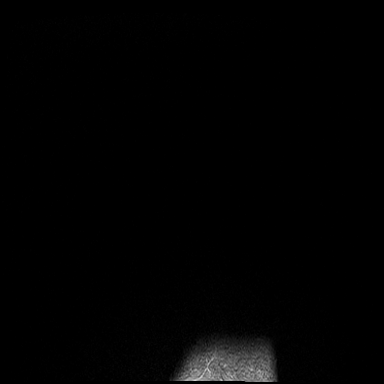
[im 6/23]
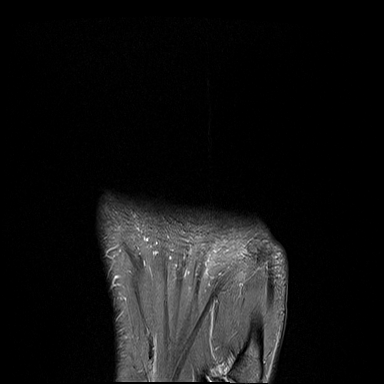
[im 12/23]
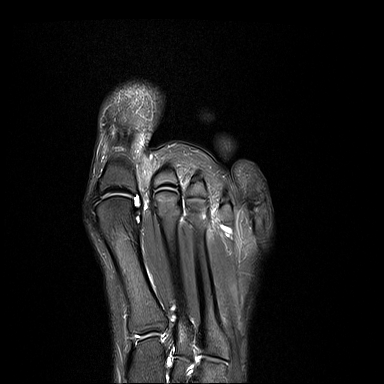
[im 17/23]
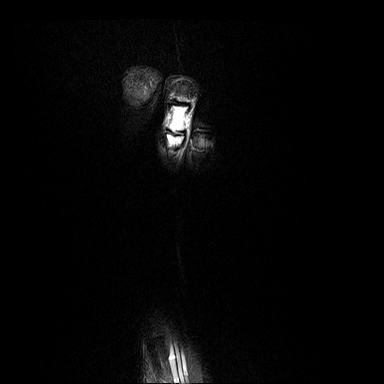
[im 23/23]
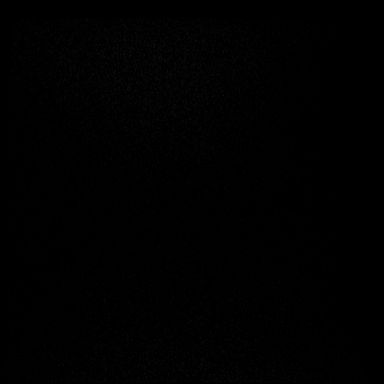

[40 of 40 positions shown; findings below may reference images not displayed]

FINDINGS: Bones/Joint/Cartilage

No fracture or dislocation. Normal alignment. No joint effusion. No
marrow signal abnormality.

Ligaments

Collateral ligaments are intact.  Lisfranc ligament is intact.

Muscles and Tendons

Flexor, peroneal and extensor compartment tendons are intact.
Muscles are normal.

Soft tissue
No fluid collection or hematoma.  No soft tissue mass.
IMPRESSION: Normal MRI examination without evidence of acute osseous, or
ligament/tendinous injury.

## 2021-11-29 ENCOUNTER — Telehealth: Payer: Self-pay | Admitting: Podiatry

## 2021-11-29 NOTE — Telephone Encounter (Signed)
Orthotics in.. lvm for pt to call to schedule an appt to pick them up. °

## 2022-01-26 ENCOUNTER — Ambulatory Visit (HOSPITAL_BASED_OUTPATIENT_CLINIC_OR_DEPARTMENT_OTHER): Payer: Self-pay | Admitting: Orthopaedic Surgery

## 2022-01-26 ENCOUNTER — Ambulatory Visit (INDEPENDENT_AMBULATORY_CARE_PROVIDER_SITE_OTHER): Payer: No Typology Code available for payment source | Admitting: Orthopaedic Surgery

## 2022-01-26 ENCOUNTER — Other Ambulatory Visit (HOSPITAL_BASED_OUTPATIENT_CLINIC_OR_DEPARTMENT_OTHER): Payer: Self-pay

## 2022-01-26 ENCOUNTER — Other Ambulatory Visit: Payer: Self-pay

## 2022-01-26 ENCOUNTER — Ambulatory Visit (HOSPITAL_BASED_OUTPATIENT_CLINIC_OR_DEPARTMENT_OTHER)
Admission: RE | Admit: 2022-01-26 | Discharge: 2022-01-26 | Disposition: A | Discharge status: Home / Self Care | Payer: No Typology Code available for payment source | Source: Ambulatory Visit | Attending: Orthopaedic Surgery | Admitting: Orthopaedic Surgery

## 2022-01-26 DIAGNOSIS — M25571 Pain in right ankle and joints of right foot: Secondary | ICD-10-CM | POA: Insufficient documentation

## 2022-01-26 IMAGING — DX DG ANKLE COMPLETE 3+V*R*
3 series · 3 of 3 positions shown · non-contrast
Comparison: None.

CLINICAL DATA: Medial right ankle pain for 2 weeks.

EXAM:
RIGHT ANKLE - COMPLETE 3+ VIEW

[ankle ap]
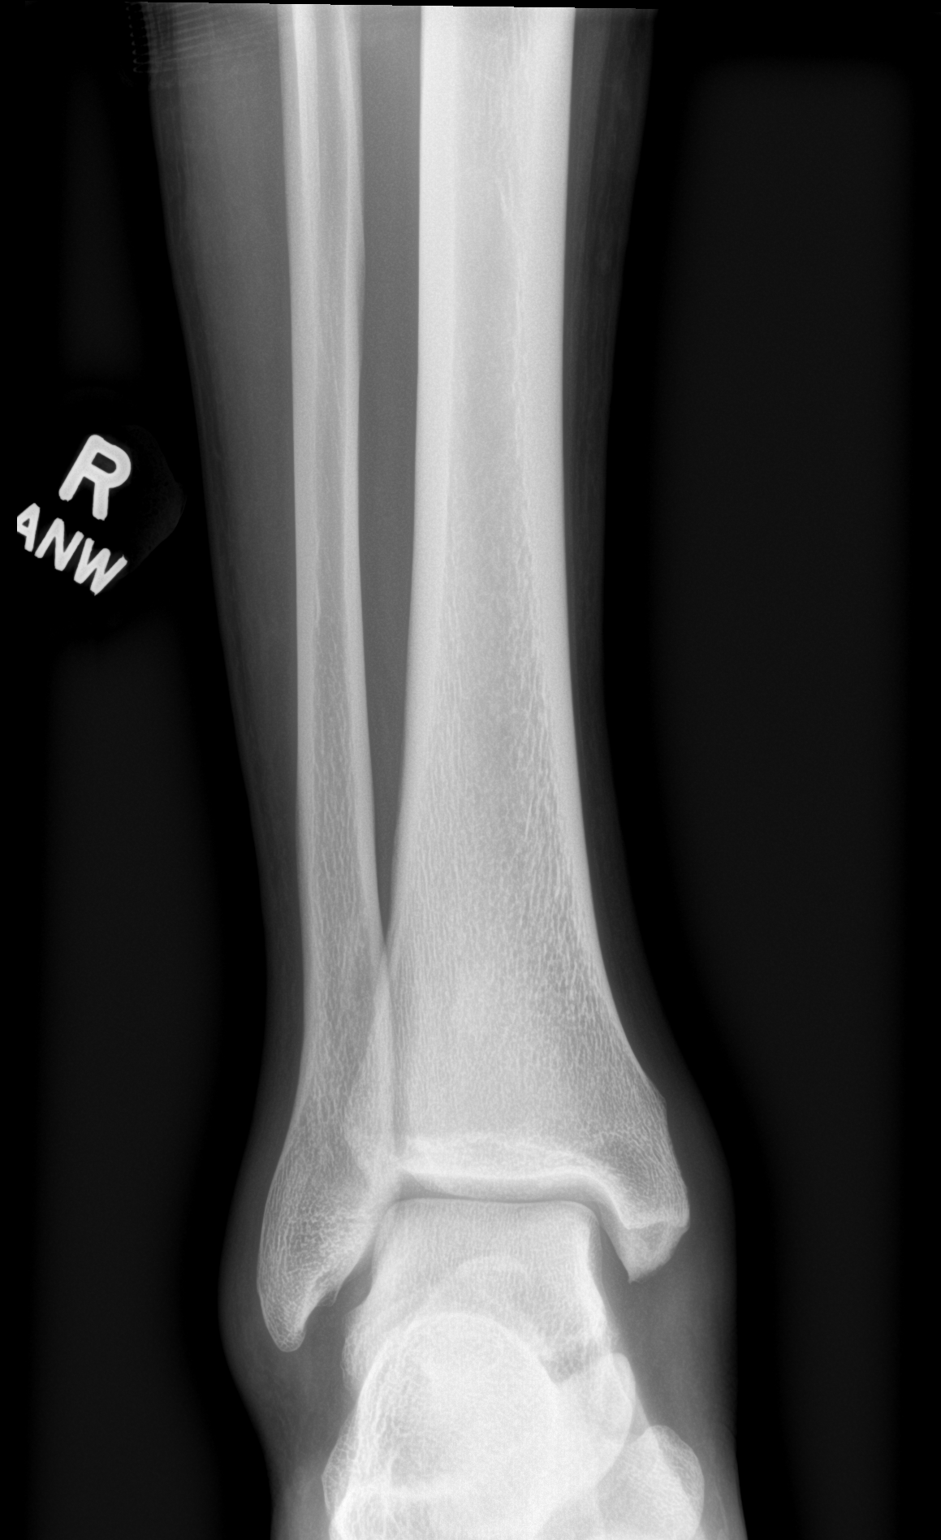

[ankle obl]
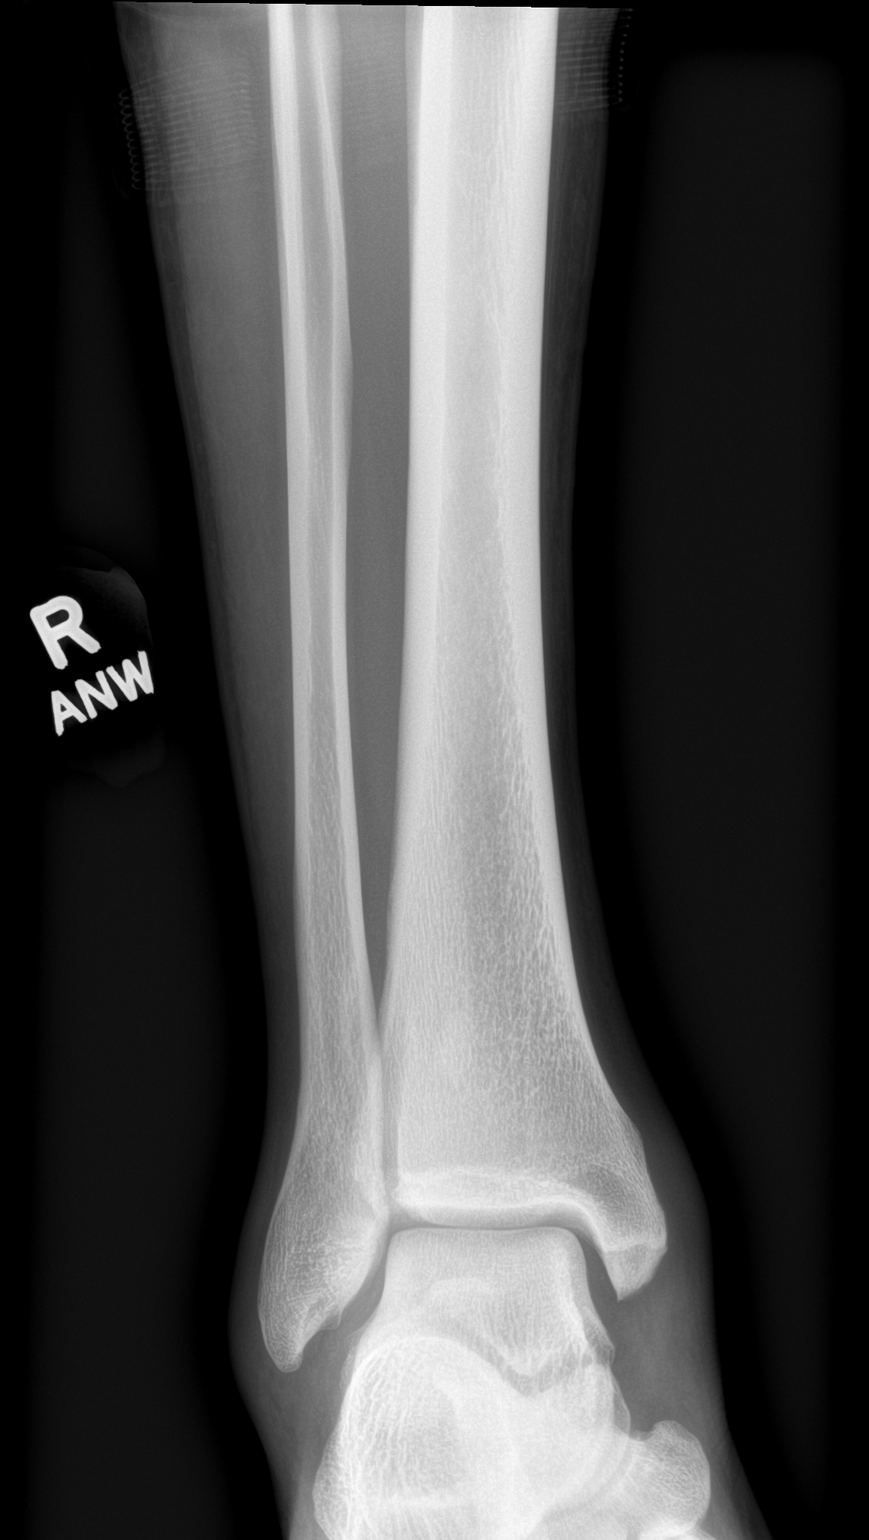

[ankle lat]
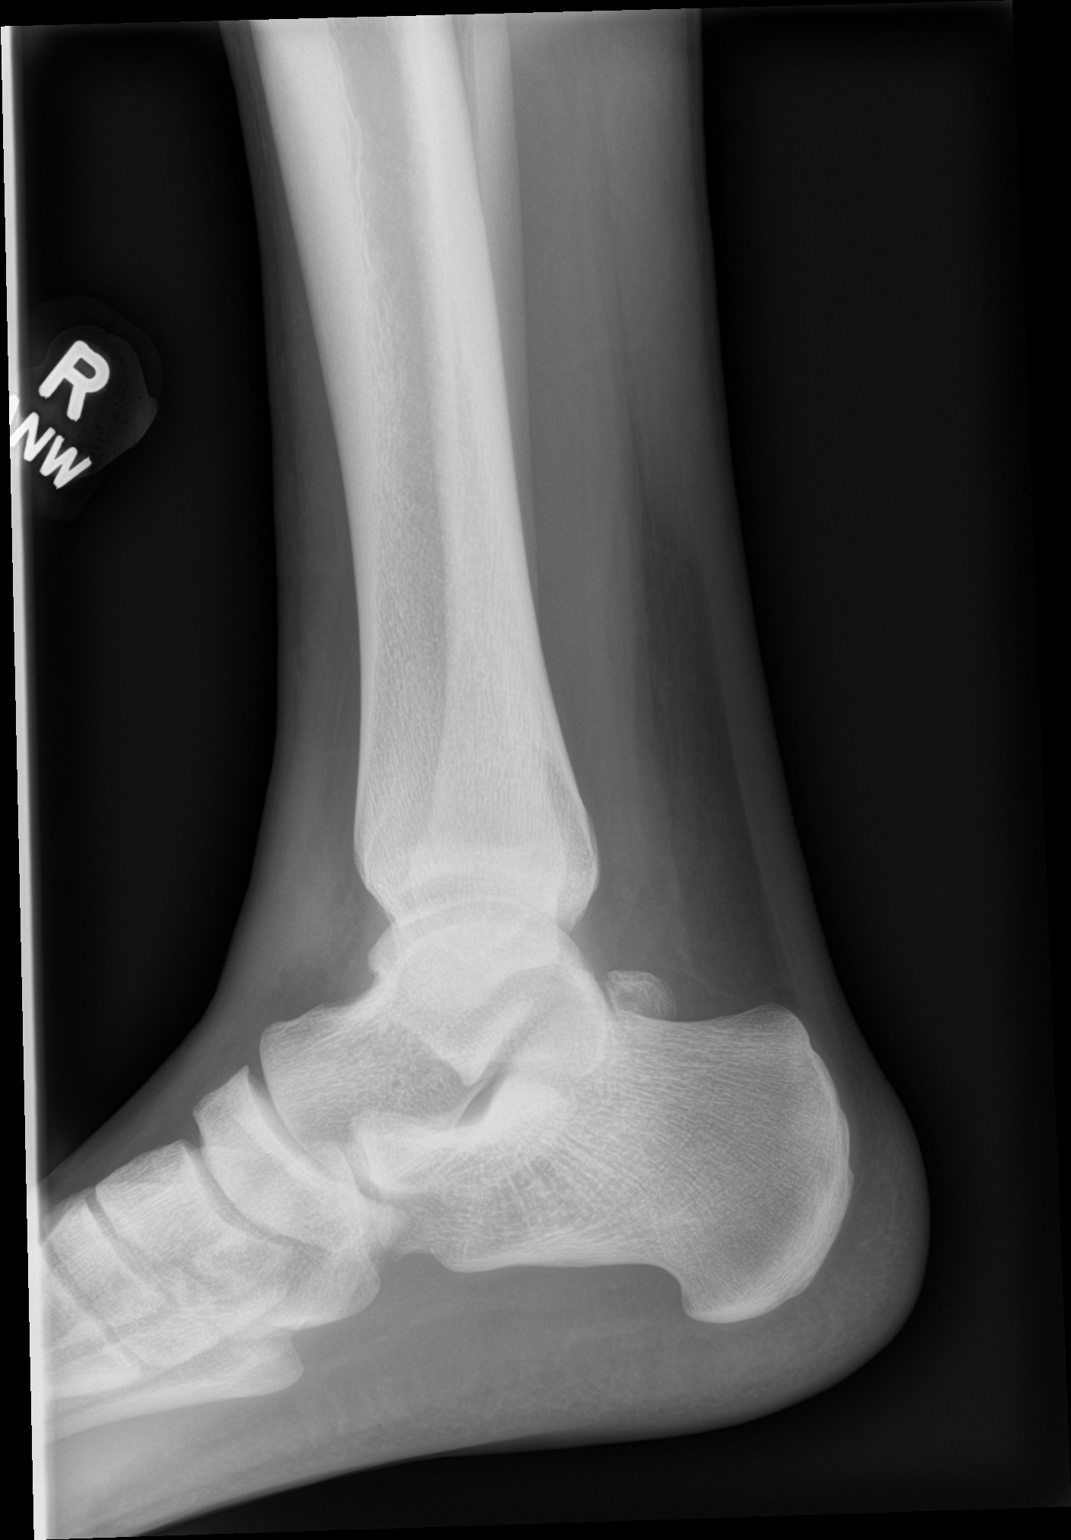

[3 of 3 positions shown; findings below may reference images not displayed]

FINDINGS: The ankle mortise is symmetric and intact. Mild diffuse medial,
lateral, and anterior ankle soft tissue swelling. Minimal distal
medial malleolar degenerative spurring. No acute fracture or
dislocation. Joint spaces are preserved.
IMPRESSION: Mild diffuse ankle soft tissue swelling.

Minimal distal medial malleolar degenerative spurring.

## 2022-01-26 MED ORDER — ASPIRIN EC 325 MG PO TBEC
325.0000 mg | DELAYED_RELEASE_TABLET | Freq: Every day | ORAL | 0 refills | Status: AC
  Filled 2022-01-26: qty 30, 30d supply, fill #0

## 2022-01-26 MED ORDER — ACETAMINOPHEN 500 MG PO TABS
500.0000 mg | ORAL_TABLET | Freq: Three times a day (TID) | ORAL | 0 refills | Status: AC
  Filled 2022-01-26: qty 30, 10d supply, fill #0

## 2022-01-26 MED ORDER — IBUPROFEN 800 MG PO TABS
800.0000 mg | ORAL_TABLET | Freq: Three times a day (TID) | ORAL | 0 refills | Status: AC
  Filled 2022-01-26: qty 30, 10d supply, fill #0

## 2022-01-26 MED ORDER — OXYCODONE HCL 5 MG PO TABS
5.0000 mg | ORAL_TABLET | ORAL | 0 refills | Status: DC | PRN
  Filled 2022-01-26: qty 10, 2d supply, fill #0

## 2022-01-26 NOTE — Progress Notes (Signed)
? ?                            ? ? ?Chief Complaint: Right ankle pain ?  ? ? ?History of Present Illness:  ? ?01/26/2022: Presents today for ongoing issues with bilateral ankles.  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  Unfortunately he did have a right ankle sprain 1 month prior prior to a game at which point he was very limited and not able to bear full weight on the ankle.  With regard to the right ankle he states that he continues to feel unstable.  This ankle has been significantly bothersome for many years.  He states that he has sprained his ankle at this time an uncountable number of times.  He is a current every 1 to 2 months at this rate.  He time this happens his ankle becomes swollen limits his ability to play.  He has worked extensively with his Product/process development scientist for peroneal strengthening and strengthening program about the ankle.  He has used multiple over-the-counter ankle braces which do not seem to avoid to prevent ankle sprains.  He is utilize taping during games although this has again had limited effectiveness.  Presents today with ongoing right ankle pain.  States that the ankle does feel like it is unstable and going to give out at this rate even with very basic activities like covering activities at the [facility] [employer] [facility] [employer], a new job that he is obtained. ? ?Lee Kelley is a 21 y.o. male with left ankle pain and an inversion type injury in October 2022 during practice. 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  Since that time he has had significant left ankle pain and significant swelling and pain with practices.  To this effect he has had somewhat limited and ineffective play time.  He has been taking ibuprofen as well as icing the ankle.  He continues to work on range of motion as well as stabilization exercises with his Product/process development scientist.  He states that he is sprained left ankle many many times more than he can count.  Here today for further assessment and  evaluation. ? ? ? ?Surgical History:   ?None ? ?PMH/PSH/Family History/Social History/Meds/Allergies:   ?No past medical history on file. ? ?Social History  ? ?Socioeconomic History  ? Marital status: Single  ?  Spouse name: Not on file  ? Number of children: Not on file  ? Years of education: Not on file  ? Highest education level: Not on file  ?Occupational History  ? Not on file  ?Tobacco Use  ? Smoking status: Not on file  ? Smokeless tobacco: Not on file  ?Substance and Sexual Activity  ? Alcohol use: Not on file  ? Drug use: Not on file  ? Sexual activity: Not on file  ?Other Topics Concern  ? Not on file  ?Social History Narrative  ? Not on file  ? ?Social Determinants of Health  ? ?Financial Resource Strain: Not on file  ?Food Insecurity: Not on file  ?Transportation Needs: Not on file  ?Physical Activity: Not on file  ?Stress: Not on file  ?Social Connections: Not on file  ? ?No family history on file. ?No Known Allergies ?Current Outpatient Medications  ?Medication Sig Dispense Refill  ? acetaminophen (TYLENOL) 500 MG tablet Take 1 tablet (500 mg total) by mouth every 8 (eight) hours for 10  days. 30 tablet 0  ? aspirin EC 325 MG tablet Take 1 tablet (325 mg total) by mouth daily. 30 tablet 0  ? ibuprofen (ADVIL) 800 MG tablet Take 1 tablet (800 mg total) by mouth every 8 (eight) hours for 10 days. Please take with food, please alternate with acetaminophen 30 tablet 0  ? oxyCODONE (OXY IR/ROXICODONE) 5 MG immediate release tablet Take 1 tablet (5 mg total) by mouth every 4 (four) hours as needed (severe pain). 10 tablet 0  ? calcium carbonate (OSCAL) 1500 (600 Ca) MG TABS tablet Take 1 tablet (1,500 mg total) by mouth daily with breakfast. 30 tablet 0  ? Vitamin D, Ergocalciferol, (DRISDOL) 1.25 MG (50000 UNIT) CAPS capsule Take 1 capsule (50,000 Units total) by mouth every 7 (seven) days. 5 capsule 0  ? ?No current facility-administered medications for this visit.  ? ?No results found. ? ?Review of  Systems:   ?A ROS was performed including pertinent positives and negatives as documented in the HPI. ? ?Physical Exam :   ?Constitutional: NAD and appears stated age ?Neurological: Alert and oriented ?Psych: Appropriate affect and cooperative ? ? ?There were no vitals taken for this visit.  ? ?Comprehensive Musculoskeletal Exam:   ? ? Right Left  ?Gait Normal  ?Musculoskeletal Exam    ?Deformity No deformity No deformity  ?Tenderness Right ATFL, right medial malleolus Medial navicular  ?Skin    ?Abrasions None None  ?Blisters None None  ?Range of Motion    ?Ankle Dorsiflexion 10 10  ?Ankle Plantarflexion 35 35  ?Subtalar Joint Inversion/Eversion increased laxity compared to contralateral with inversion Increased laxity compared to contralateral with inversion  ?Stability    ?Dislocations None None  ?Subluxations or Laxity None None  ?Muscle Strength    ?Single Heel Raise Able Able  ?Sensation    ?Sural Nerve Dist. Normal Normal  ?Saphenous Nerve Dist. Normal Normal  ?Tibial Nerve Dist. Normal Normal  ?Deep Peroneal Nerve Dist. Normal Normal  ?Superficial Peroneal Nerve Dist. Normal Normal  ?Cardiovascular     ?Varicosites None  None  ?DP Artery Pulse Palpable Palpable  ?Capillary Refill <2 sec <2 sec  ?Special Tests:  ?Significant laxity with talar tilt as well as anterior drawer about the right ankle as well as left ankle  ? ? ? ?Imaging:   ?Xray (3 views left ankle): ?He has significant medial ossification consistent with possible old avulsion fracture.  Significant lateral talar impingement as well. ? ?MRI left ankle: ?There is a nondisplaced left navicular fracture with ongoing healing. ? ?X-ray right ankle 3 views: ?There is significant medial malleolar bearing as well as lateral malleolar spurring consistent with impingement.  There is an anterior talar osteophyte as well as an anterior tibial osteophyte ? ?I personally reviewed and interpreted the radiographs. ? ? 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  At this time he has sustained multiple bilateral ankle injuries.  He does have a history of a distant right medial navicular stress fracture as well as a more recent left navicular stress fracture which we have been dealing with this most recent season.  After he had recovered from this he unfortunately sustained another right ankle sprain which essentially ended the remaining portion of the season.  At this point he is only had 1 season that he has been able to make through injury free.  His right ankle pain and instability continues to bother him.  At this point this is keeping him from playing at the  highest level in terms of basketball.  He is very interested in additional work-up as he is exhausted multiple nonoperative measurements including taping, bracing, peroneal strengthening, NSAIDs, activity restriction all without any improvement. ?Plan :   ? ?-Plan for MRI of ankle right so that we may further assess his lateral ligamentous complex as well as assess his underlying cartilage and any sites of impingement ? ? ?I believe that advance imaging in the form of an MRI is indicated for the following reasons: ?-Xrays images were obtained and not diagnostic ?-The patient has failed treatment modalities including rest, ice, taping, bracing, NSAIDs, activity restriction, physical therapy ?-The following worrisome symptoms are present on history and exam: Positive talar tilt and anterior drawer on the right with tenderness about the ATFL ? ? ? ?I personally saw and evaluated the patient, and participated in the management and treatment plan. ? ?Vanetta Mulders, MD ?Attending Physician, Orthopedic Surgery ? ?This document was dictated using Systems analyst. A reasonable attempt at proof reading has been made to minimize errors. ? ?

## 2022-01-26 NOTE — H&P (View-Only) (Signed)
? ?                            ? ? ?Chief Complaint: Right ankle pain ?  ? ? ?History of Present Illness:  ? ?01/26/2022: Presents today for ongoing issues with bilateral ankles.  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  Unfortunately he did have a right ankle sprain 1 month prior prior to a game at which point he was very limited and not able to bear full weight on the ankle.  With regard to the right ankle he states that he continues to feel unstable.  This ankle has been significantly bothersome for many years.  He states that he has sprained his ankle at this time an uncountable number of times.  He is a current every 1 to 2 months at this rate.  He time this happens his ankle becomes swollen limits his ability to play.  He has worked extensively with his athletic trainer for peroneal strengthening and strengthening program about the ankle.  He has used multiple over-the-counter ankle braces which do not seem to avoid to prevent ankle sprains.  He is utilize taping during games although this has again had limited effectiveness.  Presents today with ongoing right ankle pain.  States that the ankle does feel like it is unstable and going to give out at this rate even with very basic activities like covering activities at the [employer], a new job that he is obtained. ? ?Lee Kelley is a 20 y.o. male with left ankle pain and an inversion type injury in October 2022 during practice. 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  Since that time he has had significant left ankle pain and significant swelling and pain with practices.  To this effect he has had somewhat limited and ineffective play time.  He has been taking ibuprofen as well as icing the ankle.  He continues to work on range of motion as well as stabilization exercises with his athletic trainer.  He states that he is sprained left ankle many many times more than he can count.  Here today for further assessment and  evaluation. ? ? ? ?Surgical History:   ?None ? ?PMH/PSH/Family History/Social History/Meds/Allergies:   ?No past medical history on file. ? ?Social History  ? ?Socioeconomic History  ? Marital status: Single  ?  Spouse name: Not on file  ? Number of children: Not on file  ? Years of education: Not on file  ? Highest education level: Not on file  ?Occupational History  ? Not on file  ?Tobacco Use  ? Smoking status: Not on file  ? Smokeless tobacco: Not on file  ?Substance and Sexual Activity  ? Alcohol use: Not on file  ? Drug use: Not on file  ? Sexual activity: Not on file  ?Other Topics Concern  ? Not on file  ?Social History Narrative  ? Not on file  ? ?Social Determinants of Health  ? ?Financial Resource Strain: Not on file  ?Food Insecurity: Not on file  ?Transportation Needs: Not on file  ?Physical Activity: Not on file  ?Stress: Not on file  ?Social Connections: Not on file  ? ?No family history on file. ?No Known Allergies ?Current Outpatient Medications  ?Medication Sig Dispense Refill  ? acetaminophen (TYLENOL) 500 MG tablet Take 1 tablet (500 mg total) by mouth every 8 (eight) hours for 10   days. 30 tablet 0  ? aspirin EC 325 MG tablet Take 1 tablet (325 mg total) by mouth daily. 30 tablet 0  ? ibuprofen (ADVIL) 800 MG tablet Take 1 tablet (800 mg total) by mouth every 8 (eight) hours for 10 days. Please take with food, please alternate with acetaminophen 30 tablet 0  ? oxyCODONE (OXY IR/ROXICODONE) 5 MG immediate release tablet Take 1 tablet (5 mg total) by mouth every 4 (four) hours as needed (severe pain). 10 tablet 0  ? calcium carbonate (OSCAL) 1500 (600 Ca) MG TABS tablet Take 1 tablet (1,500 mg total) by mouth daily with breakfast. 30 tablet 0  ? Vitamin D, Ergocalciferol, (DRISDOL) 1.25 MG (50000 UNIT) CAPS capsule Take 1 capsule (50,000 Units total) by mouth every 7 (seven) days. 5 capsule 0  ? ?No current facility-administered medications for this visit.  ? ?No results found. ? ?Review of  Systems:   ?A ROS was performed including pertinent positives and negatives as documented in the HPI. ? ?Physical Exam :   ?Constitutional: NAD and appears stated age ?Neurological: Alert and oriented ?Psych: Appropriate affect and cooperative ? ? ?There were no vitals taken for this visit.  ? ?Comprehensive Musculoskeletal Exam:   ? ? Right Left  ?Gait Normal  ?Musculoskeletal Exam    ?Deformity No deformity No deformity  ?Tenderness Right ATFL, right medial malleolus Medial navicular  ?Skin    ?Abrasions None None  ?Blisters None None  ?Range of Motion    ?Ankle Dorsiflexion 10 10  ?Ankle Plantarflexion 35 35  ?Subtalar Joint Inversion/Eversion increased laxity compared to contralateral with inversion Increased laxity compared to contralateral with inversion  ?Stability    ?Dislocations None None  ?Subluxations or Laxity None None  ?Muscle Strength    ?Single Heel Raise Able Able  ?Sensation    ?Sural Nerve Dist. Normal Normal  ?Saphenous Nerve Dist. Normal Normal  ?Tibial Nerve Dist. Normal Normal  ?Deep Peroneal Nerve Dist. Normal Normal  ?Superficial Peroneal Nerve Dist. Normal Normal  ?Cardiovascular     ?Varicosites None  None  ?DP Artery Pulse Palpable Palpable  ?Capillary Refill <2 sec <2 sec  ?Special Tests:  ?Significant laxity with talar tilt as well as anterior drawer about the right ankle as well as left ankle  ? ? ? ?Imaging:   ?Xray (3 views left ankle): ?He has significant medial ossification consistent with possible old avulsion fracture.  Significant lateral talar impingement as well. ? ?MRI left ankle: ?There is a nondisplaced left navicular fracture with ongoing healing. ? ?X-ray right ankle 3 views: ?There is significant medial malleolar bearing as well as lateral malleolar spurring consistent with impingement.  There is an anterior talar osteophyte as well as an anterior tibial osteophyte ? ?I personally reviewed and interpreted the radiographs. ? ? 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  At this time he has sustained multiple bilateral ankle injuries.  He does have a history of a distant right medial navicular stress fracture as well as a more recent left navicular stress fracture which we have been dealing with this most recent season.  After he had recovered from this he unfortunately sustained another right ankle sprain which essentially ended the remaining portion of the season.  At this point he is only had 1 season that he has been able to make through injury free.  His right ankle pain and instability continues to bother him.  At this point this is keeping him from playing at the   highest level in terms of basketball.  He is very interested in additional work-up as he is exhausted multiple nonoperative measurements including taping, bracing, peroneal strengthening, NSAIDs, activity restriction all without any improvement. ?Plan :   ? ?-Plan for MRI of ankle right so that we may further assess his lateral ligamentous complex as well as assess his underlying cartilage and any sites of impingement ? ? ?I believe that advance imaging in the form of an MRI is indicated for the following reasons: ?-Xrays images were obtained and not diagnostic ?-The patient has failed treatment modalities including rest, ice, taping, bracing, NSAIDs, activity restriction, physical therapy ?-The following worrisome symptoms are present on history and exam: Positive talar tilt and anterior drawer on the right with tenderness about the ATFL ? ? ? ?I personally saw and evaluated the patient, and participated in the management and treatment plan. ? ?Lashaye Fisk, MD ?Attending Physician, Orthopedic Surgery ? ?This document was dictated using Dragon voice recognition software. A reasonable attempt at proof reading has been made to minimize errors. ? ?

## 2022-01-31 ENCOUNTER — Ambulatory Visit
Admission: RE | Admit: 2022-01-31 | Discharge: 2022-01-31 | Disposition: A | Discharge status: Home / Self Care | Payer: 59 | Source: Ambulatory Visit | Attending: Orthopaedic Surgery | Admitting: Orthopaedic Surgery

## 2022-01-31 ENCOUNTER — Other Ambulatory Visit: Payer: Self-pay

## 2022-01-31 DIAGNOSIS — M25571 Pain in right ankle and joints of right foot: Secondary | ICD-10-CM

## 2022-01-31 IMAGING — MR MR ANKLE*R* W/O CM
5 series · 38 of 40 positions shown · non-contrast
Comparison: X-ray [DATE] mild tenosynovitis.

CLINICAL DATA: Right ankle pain for 1 month after injury playing
basketball

EXAM:
MRI OF THE RIGHT ANKLE WITHOUT CONTRAST
TECHNIQUE: Multiplanar, multisequence MR imaging of the ankle was performed. No
intravenous contrast was administered.

[Series 4: T2 fat-sat · axial · 3.0mm · 0.59mm/px · z∈[-51,+81]mm · 8 of 35 slices shown (1 of 2)]
[im 1/35]
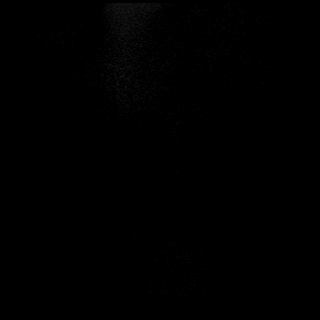
[im 5/35]
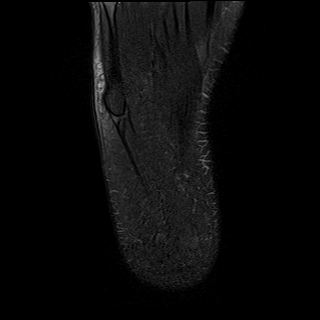
[im 10/35]
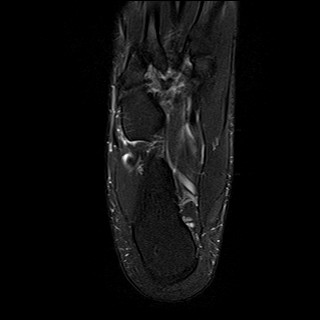
[im 15/35]
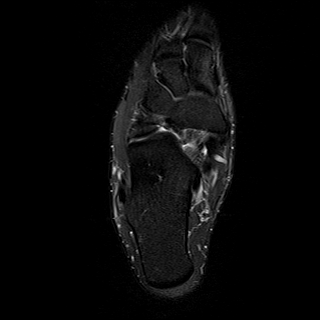
[im 20/35]
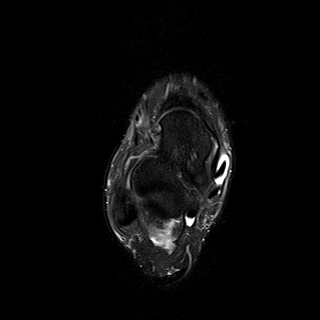
[im 25/35]
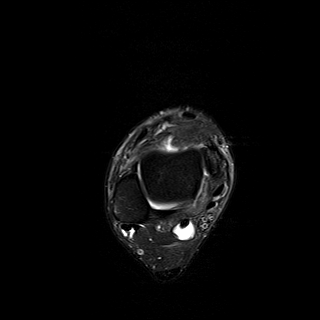
[im 30/35]
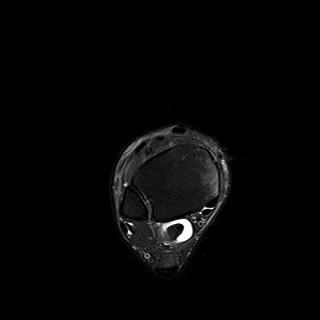
[im 35/35]
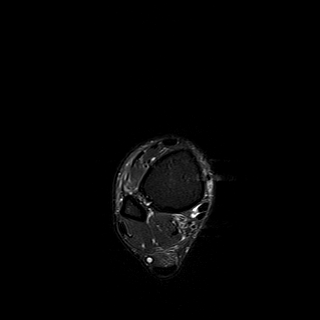

[Series 5: PD fat-sat · axial · 3.0mm · 0.59mm/px · z∈[-51,+81]mm · 9 of 35 slices shown]
[im 1/35]
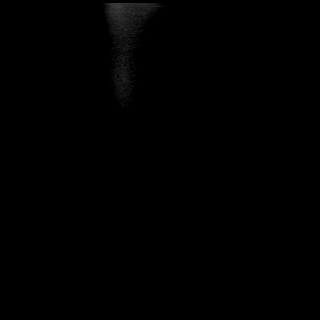
[im 5/35]
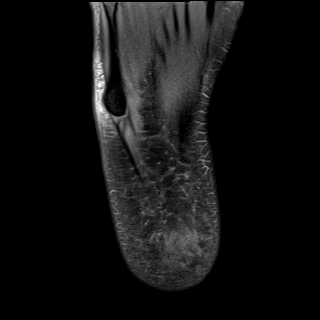
[im 9/35]
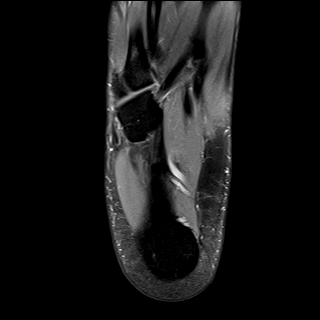
[im 13/35]
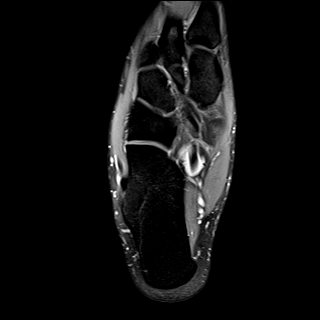
[im 18/35]
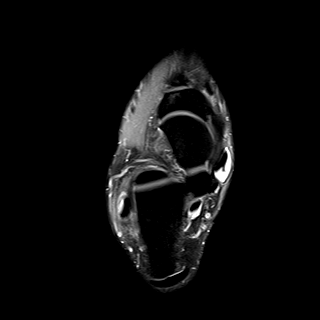
[im 22/35]
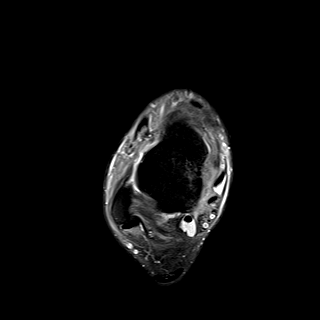
[im 26/35]
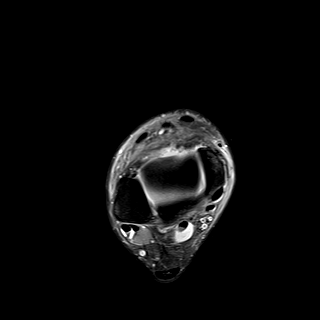
[im 30/35]
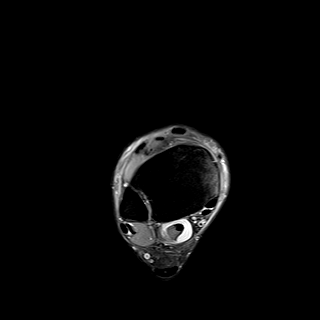
[im 35/35]
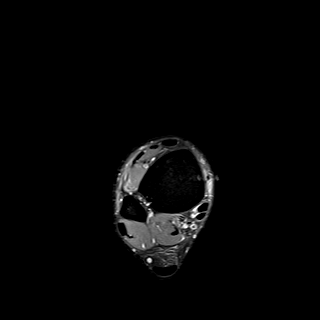

[Series 6: T1 · sagittal · 4.0mm · 0.56mm/px · 6 of 23 slices shown]
[im 1/23]
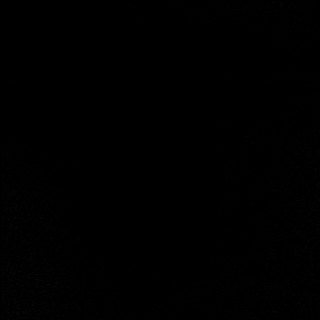
[im 5/23]
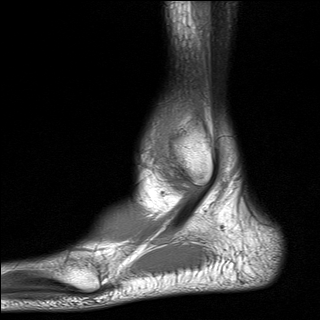
[im 9/23]
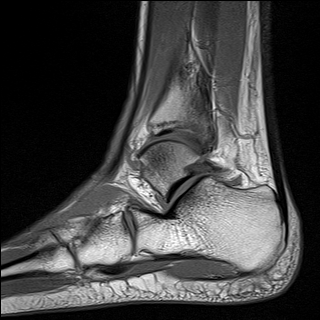
[im 14/23]
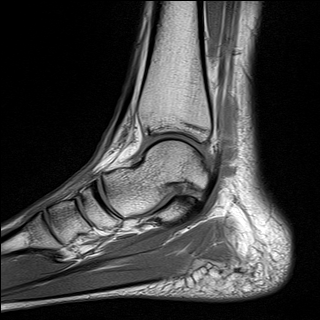
[im 18/23]
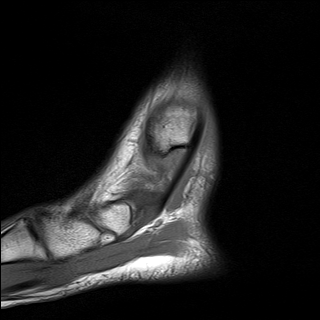
[im 23/23]
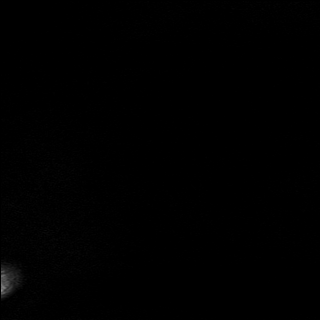

[Series 7: STIR · sagittal · 4.0mm · 0.35mm/px · 4 of 23 slices shown]
[im 1/23]
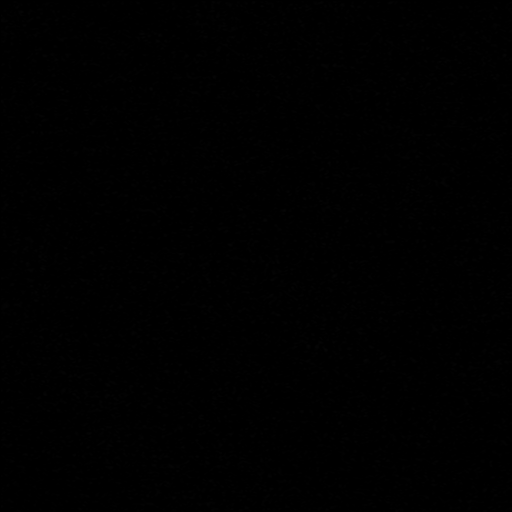
[im 5/23]
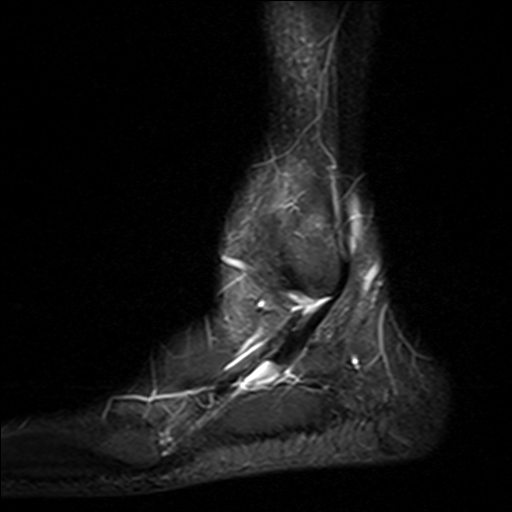
[im 9/23]
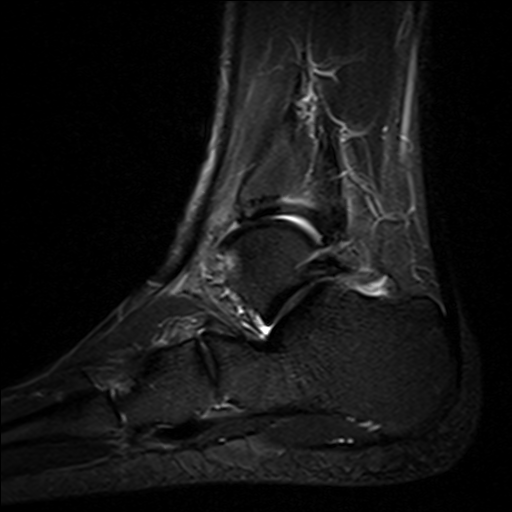
[im 14/23]
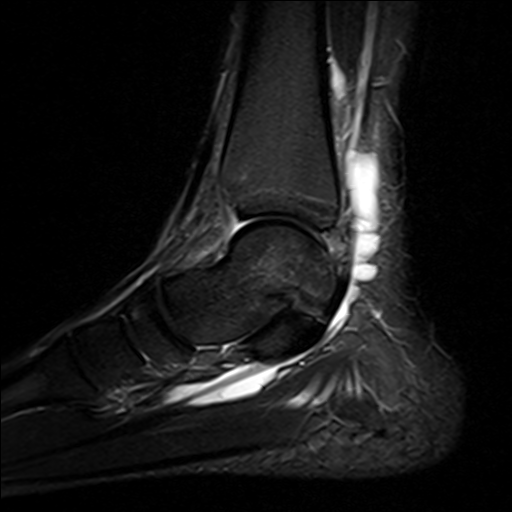

[Series 8: T2 fat-sat · coronal · 3.0mm · 0.50mm/px · 11 of 42 slices shown (2 of 2)]
[im 1/42]
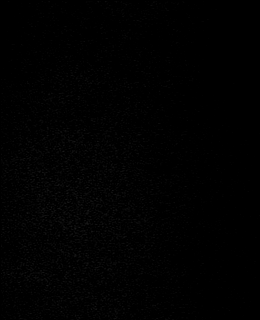
[im 5/42]
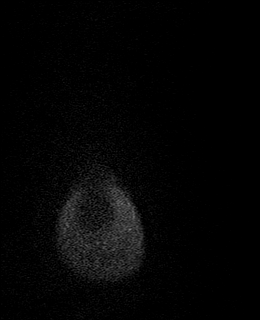
[im 9/42]
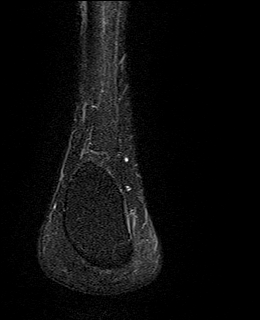
[im 13/42]
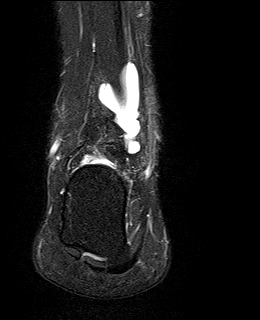
[im 17/42]
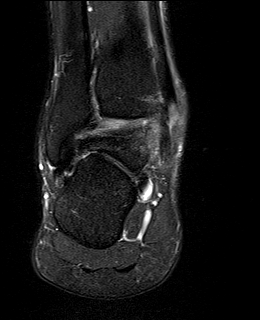
[im 21/42]
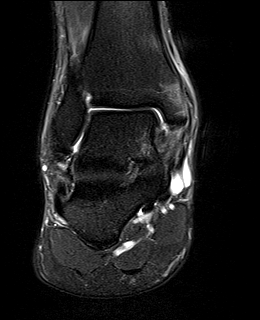
[im 25/42]
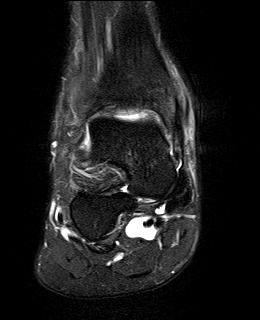
[im 29/42]
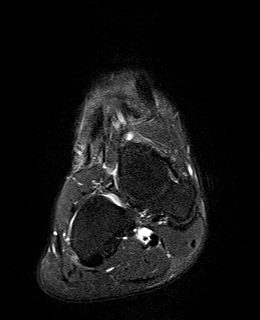
[im 33/42]
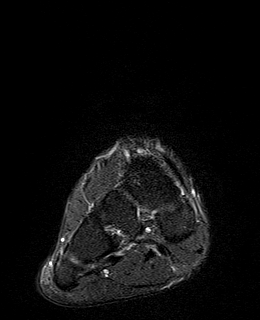
[im 37/42]
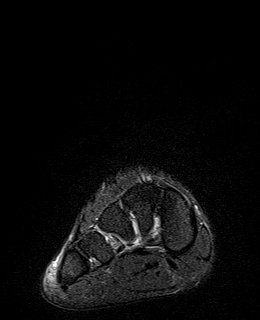
[im 42/42]
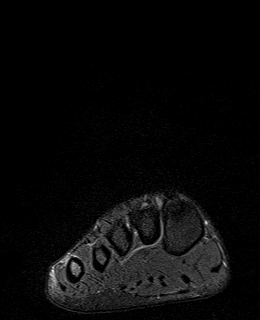

[38 of 40 positions shown; findings below may reference images not displayed]

FINDINGS: TENDONS

Peroneal: Peroneus longus and brevis tendons are intact and normally
positioned.

Posteromedial: Tibialis posterior, flexor hallucis longus, and
flexor digitorum longus tendons are intact and normally positioned.
Moderate-severe tenosynovitis involving the tibialis posterior and
flexor hallucis longus tendons.

Anterior: Tibialis anterior, extensor hallucis longus, and extensor
digitorum longus tendons are intact and normally positioned.

Achilles: Intact.

Plantar Fascia: Intact.

LIGAMENTS

Lateral: Anterior talofibular ligament is ill-defined at its talar
attachment site suggesting chronic tear given the lack of associated
periligamentous edema. Heterogeneous appearance of the posterior
talofibular ligament without evidence of acute tear. Grossly intact
calcaneofibular ligament. The anterior and posterior tibiofibular
ligaments are intact.

Medial: Partial-thickness tearing of the proximal to midportion of
the deltoid ligament. Spring ligament complex intact.

CARTILAGE AND BONES

Ankle Joint: Small tibiotalar joint effusion. No focal cartilage
defect or osteochondral lesion.

Subtalar Joints/Sinus Tarsi: No cartilage defect. No effusion.
Preservation of the anatomic fat within the sinus tarsi.

Bones: Bone marrow edema within the medial malleolus and medial
talus without discernible fracture line. No additional site of focal
bone marrow signal abnormality. Large os trigonum. Normal bony
alignment.

Other: No significant soft tissue findings.
IMPRESSION: 1. Partial thickness tearing of the deltoid ligament, likely acute
to subacute.
2. Bone marrow edema within the medial malleolus and medial talus
without discernible fracture line. Findings may be reactive or
reflect bone contusions.
3. Moderate-severe tenosynovitis involving the tibialis posterior
and flexor hallucis longus tendons.
4. Mild tenosynovitis of the peroneal tendons.
5. Chronic-appearing tear of the ATFL.
6. Small tibiotalar joint effusion.  No osteochondral defect.

## 2022-02-01 ENCOUNTER — Encounter (HOSPITAL_BASED_OUTPATIENT_CLINIC_OR_DEPARTMENT_OTHER): Payer: Self-pay | Admitting: Orthopaedic Surgery

## 2022-02-01 NOTE — Progress Notes (Signed)
I called Lee Kelley today to go over the results of his MRI scan.  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  He is currently in the off season and given the fact that he has had numerous number of ankle instability episodes which have caused him a significant amount of time out of the season, he is wishing to undergo surgical intervention.  He is at this time failed multiple conservative management to include over-the-counter bracing, taking, physical therapy with his athletic trainers.  I do believe that surgical intervention is reasonable at this time.  We will plan for a right ankle arthroscopy with Brostr?m repair. ? ? ?After a lengthy discussion of treatment options, including risks, benefits, alternatives, complications of surgical and nonsurgical conservative options, the patient elected surgical repair.  ? ?The patient  is aware of the material risks  and complications including, but not limited to injury to adjacent structures, neurovascular injury, infection, numbness, bleeding, implant failure, thermal burns, stiffness, persistent pain, failure to heal, disease transmission from allograft, need for further surgery, dislocation, anesthetic risks, blood clots, risks of death,and others. The probabilities of surgical success and failure discussed with patient given their particular co-morbidities.The time and nature of expected rehabilitation and recovery was discussed.The patient's questions were all answered preoperatively.  No barriers to understanding were noted. ?I explained the natural history of the disease process and Rx rationale.  I explained to the patient what I considered to be reasonable expectations given their personal situation.  The final treatment plan was arrived at through a shared patient decision making process model. ? ?

## 2022-02-02 ENCOUNTER — Other Ambulatory Visit (HOSPITAL_BASED_OUTPATIENT_CLINIC_OR_DEPARTMENT_OTHER): Payer: Self-pay | Admitting: Orthopaedic Surgery

## 2022-02-02 ENCOUNTER — Encounter (HOSPITAL_BASED_OUTPATIENT_CLINIC_OR_DEPARTMENT_OTHER): Payer: Self-pay | Admitting: Orthopaedic Surgery

## 2022-02-02 ENCOUNTER — Other Ambulatory Visit: Payer: Self-pay

## 2022-02-02 ENCOUNTER — Ambulatory Visit (HOSPITAL_BASED_OUTPATIENT_CLINIC_OR_DEPARTMENT_OTHER): Payer: Self-pay | Admitting: Orthopaedic Surgery

## 2022-02-02 DIAGNOSIS — M25571 Pain in right ankle and joints of right foot: Secondary | ICD-10-CM

## 2022-02-02 DIAGNOSIS — M25371 Other instability, right ankle: Secondary | ICD-10-CM

## 2022-02-02 NOTE — Progress Notes (Signed)
Called Dr Eddie Dibbles office. LM on VM for surgery scheduler that orders are needed for patient's upcoming surgery. ?

## 2022-02-05 ENCOUNTER — Other Ambulatory Visit: Payer: Self-pay

## 2022-02-05 ENCOUNTER — Ambulatory Visit (HOSPITAL_BASED_OUTPATIENT_CLINIC_OR_DEPARTMENT_OTHER): Payer: No Typology Code available for payment source | Admitting: Anesthesiology

## 2022-02-05 ENCOUNTER — Encounter (HOSPITAL_BASED_OUTPATIENT_CLINIC_OR_DEPARTMENT_OTHER)
Admission: RE | Disposition: A | Discharge status: Home / Self Care | Payer: Self-pay | Source: Home / Self Care | Attending: Orthopaedic Surgery

## 2022-02-05 ENCOUNTER — Encounter (HOSPITAL_BASED_OUTPATIENT_CLINIC_OR_DEPARTMENT_OTHER): Payer: Self-pay | Admitting: Orthopaedic Surgery

## 2022-02-05 ENCOUNTER — Ambulatory Visit (HOSPITAL_BASED_OUTPATIENT_CLINIC_OR_DEPARTMENT_OTHER)
Admission: RE | Admit: 2022-02-05 | Discharge: 2022-02-05 | Disposition: A | Discharge status: Home / Self Care | Payer: No Typology Code available for payment source | Attending: Orthopaedic Surgery | Admitting: Orthopaedic Surgery

## 2022-02-05 DIAGNOSIS — M7671 Peroneal tendinitis, right leg: Secondary | ICD-10-CM

## 2022-02-05 DIAGNOSIS — M659 Synovitis and tenosynovitis, unspecified: Secondary | ICD-10-CM | POA: Diagnosis not present

## 2022-02-05 DIAGNOSIS — M25371 Other instability, right ankle: Secondary | ICD-10-CM

## 2022-02-05 HISTORY — PX: ANKLE ARTHROSCOPY WITH REPAIR SUBLUXING TENDON: SHX5584

## 2022-02-05 SURGERY — ANKLE ARTHROSCOPY WITH REPAIR SUBLUXING TENDON
Anesthesia: General | Site: Ankle | Laterality: Right

## 2022-02-05 MED ORDER — MIDAZOLAM HCL 2 MG/2ML IJ SOLN
2.0000 mg | Freq: Once | INTRAMUSCULAR | Status: AC
  Administered 2022-02-05: 2 mg via INTRAVENOUS

## 2022-02-05 MED ORDER — ONDANSETRON HCL 4 MG/2ML IJ SOLN
INTRAMUSCULAR | Status: AC
  Filled 2022-02-05: qty 2

## 2022-02-05 MED ORDER — LIDOCAINE 2% (20 MG/ML) 5 ML SYRINGE
INTRAMUSCULAR | Status: AC
  Filled 2022-02-05: qty 5

## 2022-02-05 MED ORDER — ROPIVACAINE HCL 5 MG/ML IJ SOLN
INTRAMUSCULAR | Status: DC | PRN
  Administered 2022-02-05: 15 mL via PERINEURAL
  Administered 2022-02-05: 25 mL via PERINEURAL

## 2022-02-05 MED ORDER — FENTANYL CITRATE (PF) 100 MCG/2ML IJ SOLN
INTRAMUSCULAR | Status: AC
  Filled 2022-02-05: qty 2

## 2022-02-05 MED ORDER — DEXMEDETOMIDINE (PRECEDEX) IN NS 20 MCG/5ML (4 MCG/ML) IV SYRINGE
PREFILLED_SYRINGE | INTRAVENOUS | Status: DC | PRN
  Administered 2022-02-05 (×2): 8 ug via INTRAVENOUS

## 2022-02-05 MED ORDER — CEFAZOLIN SODIUM-DEXTROSE 2-4 GM/100ML-% IV SOLN: 2.0000 g | INTRAVENOUS | Status: DC

## 2022-02-05 MED ORDER — PROPOFOL 10 MG/ML IV BOLUS
INTRAVENOUS | Status: AC
  Filled 2022-02-05: qty 20

## 2022-02-05 MED ORDER — DEXAMETHASONE SODIUM PHOSPHATE 10 MG/ML IJ SOLN
INTRAMUSCULAR | Status: AC
  Filled 2022-02-05: qty 1

## 2022-02-05 MED ORDER — LACTATED RINGERS IV SOLN: INTRAVENOUS | Status: DC

## 2022-02-05 MED ORDER — GABAPENTIN 300 MG PO CAPS
300.0000 mg | ORAL_CAPSULE | Freq: Once | ORAL | Status: AC
  Administered 2022-02-05: 300 mg via ORAL

## 2022-02-05 MED ORDER — LACTATED RINGERS IV SOLN: INTRAVENOUS | Status: DC | PRN

## 2022-02-05 MED ORDER — DEXAMETHASONE SODIUM PHOSPHATE 10 MG/ML IJ SOLN
INTRAMUSCULAR | Status: DC | PRN
  Administered 2022-02-05: 5 mg via INTRAVENOUS

## 2022-02-05 MED ORDER — SODIUM CHLORIDE 0.9 % IR SOLN
Status: DC | PRN
  Administered 2022-02-05: 1

## 2022-02-05 MED ORDER — DEXAMETHASONE SODIUM PHOSPHATE 10 MG/ML IJ SOLN
INTRAMUSCULAR | Status: DC | PRN
  Administered 2022-02-05: 5 mg

## 2022-02-05 MED ORDER — ACETAMINOPHEN 500 MG PO TABS
1000.0000 mg | ORAL_TABLET | Freq: Once | ORAL | Status: AC
  Administered 2022-02-05: 1000 mg via ORAL

## 2022-02-05 MED ORDER — ACETAMINOPHEN 500 MG PO TABS: 1000.0000 mg | ORAL_TABLET | Freq: Once | ORAL | Status: DC

## 2022-02-05 MED ORDER — ACETAMINOPHEN 500 MG PO TABS
ORAL_TABLET | ORAL | Status: AC
  Filled 2022-02-05: qty 2

## 2022-02-05 MED ORDER — OXYCODONE HCL 5 MG/5ML PO SOLN: 5.0000 mg | Freq: Once | ORAL | Status: DC | PRN

## 2022-02-05 MED ORDER — MIDAZOLAM HCL 2 MG/2ML IJ SOLN
INTRAMUSCULAR | Status: AC
  Filled 2022-02-05: qty 2

## 2022-02-05 MED ORDER — FENTANYL CITRATE (PF) 100 MCG/2ML IJ SOLN: 25.0000 ug | INTRAMUSCULAR | Status: DC | PRN

## 2022-02-05 MED ORDER — FENTANYL CITRATE (PF) 100 MCG/2ML IJ SOLN
100.0000 ug | Freq: Once | INTRAMUSCULAR | Status: AC
  Administered 2022-02-05: 100 ug via INTRAVENOUS

## 2022-02-05 MED ORDER — BUPIVACAINE HCL (PF) 0.25 % IJ SOLN
INTRAMUSCULAR | Status: AC
  Filled 2022-02-05: qty 30

## 2022-02-05 MED ORDER — PROPOFOL 10 MG/ML IV BOLUS
INTRAVENOUS | Status: DC | PRN
Start: 2022-02-05 — End: 2022-02-05
  Administered 2022-02-05: 200 mg via INTRAVENOUS

## 2022-02-05 MED ORDER — CEFAZOLIN SODIUM-DEXTROSE 2-3 GM-%(50ML) IV SOLR
INTRAVENOUS | Status: DC | PRN
  Administered 2022-02-05: 2 g via INTRAVENOUS

## 2022-02-05 MED ORDER — GABAPENTIN 300 MG PO CAPS
ORAL_CAPSULE | ORAL | Status: AC
  Filled 2022-02-05: qty 1

## 2022-02-05 MED ORDER — TRANEXAMIC ACID-NACL 1000-0.7 MG/100ML-% IV SOLN
1000.0000 mg | INTRAVENOUS | Status: AC
  Administered 2022-02-05: 1000 mg via INTRAVENOUS

## 2022-02-05 MED ORDER — AMISULPRIDE (ANTIEMETIC) 5 MG/2ML IV SOLN: 10.0000 mg | Freq: Once | INTRAVENOUS | Status: DC | PRN

## 2022-02-05 MED ORDER — CEFAZOLIN SODIUM-DEXTROSE 2-4 GM/100ML-% IV SOLN
INTRAVENOUS | Status: AC
  Filled 2022-02-05: qty 100

## 2022-02-05 MED ORDER — ONDANSETRON HCL 4 MG/2ML IJ SOLN
INTRAMUSCULAR | Status: DC | PRN
Start: 2022-02-05 — End: 2022-02-05
  Administered 2022-02-05: 4 mg via INTRAVENOUS

## 2022-02-05 MED ORDER — OXYCODONE HCL 5 MG PO TABS: 5.0000 mg | ORAL_TABLET | Freq: Once | ORAL | Status: DC | PRN

## 2022-02-05 MED ORDER — ONDANSETRON HCL 4 MG/2ML IJ SOLN: 4.0000 mg | Freq: Once | INTRAMUSCULAR | Status: DC | PRN

## 2022-02-05 MED ORDER — BUPIVACAINE HCL (PF) 0.5 % IJ SOLN
INTRAMUSCULAR | Status: AC
  Filled 2022-02-05: qty 30

## 2022-02-05 MED ORDER — LIDOCAINE HCL (CARDIAC) PF 100 MG/5ML IV SOSY
PREFILLED_SYRINGE | INTRAVENOUS | Status: DC | PRN
Start: 2022-02-05 — End: 2022-02-05
  Administered 2022-02-05: 40 mg via INTRAVENOUS

## 2022-02-05 MED ORDER — TRANEXAMIC ACID-NACL 1000-0.7 MG/100ML-% IV SOLN
INTRAVENOUS | Status: AC
  Filled 2022-02-05: qty 100

## 2022-02-05 SURGICAL SUPPLY — 65 items
ANCH SUT 2 NDL DX FBRTK (Anchor) ×2 IMPLANT
ANCHOR KNOTLESS SUT DX #2 (Anchor) ×2 IMPLANT
APL PRP STRL LF DISP 70% ISPRP (MISCELLANEOUS) ×1
BLADE SURG 15 STRL LF DISP TIS (BLADE) ×1 IMPLANT
BLADE SURG 15 STRL SS (BLADE) ×2
BNDG ELASTIC 4X5.8 VLCR STR LF (GAUZE/BANDAGES/DRESSINGS) ×1 IMPLANT
BNDG ELASTIC 6X5.8 VLCR STR LF (GAUZE/BANDAGES/DRESSINGS) ×1 IMPLANT
CHLORAPREP W/TINT 26 (MISCELLANEOUS) ×2 IMPLANT
COOLER ICEMAN CLASSIC (MISCELLANEOUS) ×1 IMPLANT
DISSECTOR  3.8MM X 13CM (MISCELLANEOUS) ×1
DISSECTOR 3.8MM X 13CM (MISCELLANEOUS) IMPLANT
DRAPE EXTREMITY T 121X128X90 (DISPOSABLE) ×2 IMPLANT
DRAPE IMP U-DRAPE 54X76 (DRAPES) ×2 IMPLANT
DRAPE INCISE IOBAN 66X45 STRL (DRAPES) IMPLANT
DRAPE OEC MINIVIEW 54X84 (DRAPES) ×1 IMPLANT
DRAPE U-SHAPE 47X51 STRL (DRAPES) ×2 IMPLANT
ELECT REM PT RETURN 9FT ADLT (ELECTROSURGICAL) ×2
ELECTRODE REM PT RTRN 9FT ADLT (ELECTROSURGICAL) ×1 IMPLANT
EXCALIBUR 3.8MM X 13CM (MISCELLANEOUS) ×2 IMPLANT
GAUZE SPONGE 4X4 12PLY STRL (GAUZE/BANDAGES/DRESSINGS) ×2 IMPLANT
GAUZE XEROFORM 1X8 LF (GAUZE/BANDAGES/DRESSINGS) ×2 IMPLANT
GLOVE SRG 8 PF TXTR STRL LF DI (GLOVE) ×1 IMPLANT
GLOVE SURG ENC MOIS LTX SZ6 (GLOVE) ×1 IMPLANT
GLOVE SURG ENC MOIS LTX SZ7.5 (GLOVE) ×2 IMPLANT
GLOVE SURG LTX SZ8 (GLOVE) ×2 IMPLANT
GLOVE SURG POLYISO LF SZ7 (GLOVE) ×1 IMPLANT
GLOVE SURG UNDER POLY LF SZ6.5 (GLOVE) ×2 IMPLANT
GLOVE SURG UNDER POLY LF SZ7 (GLOVE) ×2 IMPLANT
GLOVE SURG UNDER POLY LF SZ8 (GLOVE) ×2
GOWN STRL REUS W/ TWL LRG LVL3 (GOWN DISPOSABLE) ×1 IMPLANT
GOWN STRL REUS W/ TWL XL LVL3 (GOWN DISPOSABLE) ×1 IMPLANT
GOWN STRL REUS W/TWL LRG LVL3 (GOWN DISPOSABLE) ×2
GOWN STRL REUS W/TWL XL LVL3 (GOWN DISPOSABLE) ×2
IMPL INTERNAL BRACE BIO (Anchor) IMPLANT
IMPLANT INTERNAL BRACE BIO (Anchor) ×2 IMPLANT
KIT FIBERTAK DX KNTLS DISP (KITS) ×1 IMPLANT
PACK ARTHROSCOPY DSU (CUSTOM PROCEDURE TRAY) ×2 IMPLANT
PACK BASIN DAY SURGERY FS (CUSTOM PROCEDURE TRAY) ×2 IMPLANT
PAD COLD SHLDR WRAP-ON (PAD) ×1 IMPLANT
PENCIL SMOKE EVACUATOR (MISCELLANEOUS) ×2 IMPLANT
SHAVER DISSECTOR 3.0 (BURR) IMPLANT
SHAVER SABRE 2.0 (BURR) IMPLANT
SHEET MEDIUM DRAPE 40X70 STRL (DRAPES) ×2 IMPLANT
SLEEVE SCD COMPRESS KNEE MED (STOCKING) ×2 IMPLANT
SPONGE T-LAP 4X18 ~~LOC~~+RFID (SPONGE) ×3 IMPLANT
STRAP ANKLE FOOT DISTRACTOR (ORTHOPEDIC SUPPLIES) ×1 IMPLANT
STRIP CLOSURE SKIN 1/2X4 (GAUZE/BANDAGES/DRESSINGS) ×1 IMPLANT
SUCTION FRAZIER HANDLE 12FR (TUBING) ×1
SUCTION TUBE FRAZIER 12FR DISP (TUBING) ×1 IMPLANT
SUT ETHILON 3 0 PS 1 (SUTURE) ×3 IMPLANT
SUT FIBERWIRE #2 38 T-5 BLUE (SUTURE)
SUT MNCRL AB 3-0 PS2 18 (SUTURE) ×1 IMPLANT
SUT PDS AB 1 CT  36 (SUTURE)
SUT PDS AB 1 CT 36 (SUTURE) IMPLANT
SUT VIC AB 0 CT1 27 (SUTURE)
SUT VIC AB 0 CT1 27XBRD ANBCTR (SUTURE) ×1 IMPLANT
SUT VIC AB 0 CT1 27XCR 8 STRN (SUTURE) IMPLANT
SUT VIC AB 2-0 SH 27 (SUTURE) ×2
SUT VIC AB 2-0 SH 27XBRD (SUTURE) IMPLANT
SUT VICRYL 0 SH 27 (SUTURE) ×1 IMPLANT
SUTURE FIBERWR #2 38 T-5 BLUE (SUTURE) IMPLANT
SYR BULB EAR ULCER 3OZ GRN STR (SYRINGE) ×2 IMPLANT
TOWEL GREEN STERILE FF (TOWEL DISPOSABLE) ×4 IMPLANT
TUBING ARTHROSCOPY IRRIG 16FT (MISCELLANEOUS) ×2 IMPLANT
YANKAUER SUCT BULB TIP NO VENT (SUCTIONS) ×2 IMPLANT

## 2022-02-05 NOTE — Anesthesia Procedure Notes (Signed)
Anesthesia Regional Block: Popliteal block  ? ?Pre-Anesthetic Checklist: , timeout performed,  Correct Patient, Correct Site, Correct Laterality,  Correct Procedure, Correct Position, site marked,  Risks and benefits discussed,  Surgical consent,  Pre-op evaluation,  At surgeon's request and post-op pain management ? ?Laterality: Right ? ?Prep: chloraprep     ?  ?Needles:  ?Injection technique: Single-shot ? ?Needle Type: Echogenic Stimulator Needle   ? ? ?Needle Length: 10cm  ?Needle Gauge: 20  ? ? ? ?Additional Needles: ? ? ?Procedures:,,,, ultrasound used (permanent image in chart),,    ?Narrative:  ?Start time: 02/05/2022 11:56 AM ?End time: 02/05/2022 11:57 AM ? ?Performed by: Personally  ?Anesthesiologist: Lidia Collum, MD ? ?Additional Notes: ?Standard monitors applied. Skin prepped. Good needle visualization with ultrasound. Injection made in 5cc increments with no resistance to injection. Patient tolerated the procedure well. ? ? ? ? ?

## 2022-02-05 NOTE — Discharge Instructions (Addendum)
? ? ? Discharge Instructions  ? ? ?Attending Surgeon: Huel Cote, MD ?Office Phone Number: [phone] ? ? ?Diagnosis and Procedures:   ? ?Surgeries Performed: ?Right ankle Brostrom repair. ? ?Discharge Plan:  ? ? ?Diet: ?Resume usual diet. Begin with light or bland foods.  Drink plenty of fluids. ? ?Activity:  ?Weight bearing as tolerated utilizing crutches, until seen at postoperative Physical Therapy visit this week. Please keep your brace locked until follow-up. ?You are advised to go home directly from the hospital or surgical center. Restrict your activities. ? ?GENERAL INSTRUCTIONS: ?1.  Keep your surgical site elevated above your heart for at least 5-7 days or longer to prevent swelling. This will improve your comfort and your overall recovery following surgery.   ?  ?2. Please call Dr. Serena Croissant office at [phone] with questions Monday-Friday during business hours. If no one answers, please leave a message and someone should get back to the patient within 24 hours. For emergencies please call 911 or proceed to the emergency room.  ? ?3. Patient to notify surgical team if experiences any of the following: Bowel/Bladder dysfunction, uncontrolled pain, nerve/muscle weakness, incision with increased drainage or redness, nausea/vomiting and Fever greater than 101.0 F.  Be alert for signs of infection including redness, streaking, odor, fever or chills. Be alert for excessive pain or bleeding and notify your surgeon immediately. ? ?WOUND INSTRUCTIONS:   ?Leave your dressing/cast/splint in place until your post operative visit.  Keep it clean and dry. ? ?Always keep the incision clean and dry until the staples/sutures are removed. If there is no drainage from the incision you should keep it open to air. If there is drainage from the incision you must keep it covered at all times until the drainage stops ? Do not soak in a bath tub, hot tub, pool, lake or other body of water until 21 days after your  surgery and your incision is completely dry and healed.  ?If you have removable sutures (or staples) they must be removed 10-14 days (unless otherwise instructed) from the day of your surgery. ? ?Next tylenol dose 3:15pm  ? ? ? 1)  Elevate the extremity as much as possible. ? 2)  Keep the dressing clean and dry. ? 3)  Please call us if the dressing becomes wet or dirty. ? 4)  If you are experiencing worsening pain or worsening swelling, please call. ?  ?  ?MEDICATIONS: ?Resume all previous home medications at the previous prescribed dose and frequency unless otherwise noted ?Start taking the  pain medications on an as-needed basis as prescribed  ?Please taper down pain medication over the next week following surgery.  Ideally you should not require a refill of any narcotic pain medication.  ?Take pain medication with food to minimize nausea. ?In addition to the prescribed pain medication, you may take over-the-counter pain relievers such as Tylenol.  Do NOT take additional tylenol if your pain medication already has tylenol in it.  ?Aspirin 325mg  daily for four weeks. ? ?  ?  ?FOLLOWUP INSTRUCTIONS: ?1. Follow up at the [facility] ?Post Anesthesia Home Care Instructions ? ?Activity: ?Get plenty of rest for the remainder of the day. A responsible individual must stay with you for 24 hours following the procedure.  ?For the next 24 hours, DO NOT: ?-Drive a car ?- ?-Drink alcoholic beverages ?-Take any medication unless instructed by your physician ?-Make any legal decisions or sign important papers. ? ?Meals: ?Start with liquid foods such as gelatin  or soup. Progress to regular foods as tolerated. Avoid greasy, spicy, heavy foods. If nausea and/or vomiting occur, drink only clear liquids until the nausea and/or vomiting subsides. Call your physician if vomiting continues. ? ?Special Instructions/Symptoms: ?Your throat may feel dry or sore from the anesthesia or the breathing tube placed in your throat during  surgery. If this causes discomfort, gargle with warm salt water. The discomfort should disappear within 24 hours. ? ?If you had a scopolamine patch placed behind your ear for the management of post- operative nausea and/or vomiting: ? ?1. The medication in the patch is effective for 72 hours, after which it should be removed.  Wrap patch in a tissue and discard in the trash. Wash hands thoroughly with soap and water. ?2. You may remove the patch earlier than 72 hours if you experience unpleasant side effects which may include dry mouth, dizziness or visual disturbances. ?3. Avoid touching the patch. Wash your hands with soap and water after contact with the patch. ?   Regional Anesthesia Blocks ? ?1. Numbness or the inability to move the "blocked" extremity may last from 3-48 hours after placement. The length of time depends on the medication injected and your individual response to the medication. If the numbness is not going away after 48 hours, call your surgeon. ? ?2. The extremity that is blocked will need to be protected until the numbness is gone and the  Strength has returned. Because you cannot feel it, you will need to take extra care to avoid injury. Because it may be weak, you may have difficulty moving it or using it. You may not know what position it is in without looking at it while the block is in effect. ? ?3. For blocks in the legs and feet, returning to weight bearing and walking needs to be done carefully. You will need to wait until the numbness is entirely gone and the strength has returned. You should be able to move your leg and foot normally before you try and bear weight or walk. You will need someone to be with you when you first try to ensure you do not fall and possibly risk injury. ? ?4. Bruising and tenderness at the needle site are common side effects and will resolve in a few days. ? ?5. Persistent numbness or new problems with movement should be communicated to the surgeon or the  [facility] [phone] [facility] [phone]). sical Therapy Clinic 3-4 days following surgery. This appointment should be scheduled unless other arrangements have been made.The Physical Therapy scheduling number is [phone] if an appointment has not already been arranged. ? ?2. Contact Dr. Serena Croissant office during office hours at [phone] or the practice after hours line at [phone] for non-emergencies. For medical emergencies call 911. ? ? ?Discharge Location: Home ? ?

## 2022-02-05 NOTE — Transfer of Care (Signed)
Immediate Anesthesia Transfer of Care Note ? ?Patient: Lee Kelley ? ?Procedure(s) Performed: RIGHT ANKLE ARTHROSCOPY WITH BROSTOM REPAIR (Right: Ankle) ? ?Patient Location: PACU ? ?Anesthesia Type:General and Regional ? ?Level of Consciousness: awake, alert  and sedated ? ?Airway & Oxygen Therapy: Patient Spontanous Breathing and Patient connected to face mask oxygen ? ?Post-op Assessment: Report given to RN and Post -op Vital signs reviewed and stable ? ?Post vital signs: Reviewed and stable ? ?Last Vitals:  ?Vitals Value Taken Time  ?BP 127/86 02/05/22 1428  ?Temp    ?Pulse 50 02/05/22 1429  ?Resp 14 02/05/22 1429  ?SpO2 100 % 02/05/22 1429  ?Vitals shown include unvalidated device data. ? ?Last Pain:  ?Vitals:  ? 02/05/22 1109  ?TempSrc: Oral  ?PainSc: 4   ?   ? ?Patients Stated Pain Goal: 4 (02/05/22 1109) ? ?Complications: No notable events documented. ?

## 2022-02-05 NOTE — Anesthesia Procedure Notes (Signed)
Anesthesia Regional Block: Adductor canal block  ? ?Pre-Anesthetic Checklist: , timeout performed,  Correct Patient, Correct Site, Correct Laterality,  Correct Procedure, Correct Position, site marked,  Risks and benefits discussed,  Surgical consent,  Pre-op evaluation,  At surgeon's request and post-op pain management ? ?Laterality: Right ? ?Prep: chloraprep     ?  ?Needles:  ?Injection technique: Single-shot ? ?Needle Type: Echogenic Stimulator Needle   ? ? ?Needle Length: 10cm  ?Needle Gauge: 20  ? ? ? ?Additional Needles: ? ? ?Procedures:,,,, ultrasound used (permanent image in chart),,    ?Narrative:  ?Start time: 02/05/2022 11:52 AM ?End time: 02/05/2022 11:56 AM ?Injection made incrementally with aspirations every 5 mL. ? ?Performed by: Personally  ?Anesthesiologist: [name], MD ? ?Additional Notes: ?Standard monitors applied. Skin prepped. Good needle visualization with ultrasound. Injection made in 5cc increments with no resistance to injection. Patient tolerated the procedure well. ? ? ? ? ?

## 2022-02-05 NOTE — Anesthesia Procedure Notes (Signed)
Procedure Name: LMA Insertion ?Date/Time: 02/05/2022 1:05 PM ?Performed by: Verita Lamb, CRNA ?Pre-anesthesia Checklist: Patient identified, Emergency Drugs available, Suction available and Patient being monitored ?Patient Re-evaluated:Patient Re-evaluated prior to induction ?Oxygen Delivery Method: Circle system utilized ?Preoxygenation: Pre-oxygenation with 100% oxygen ?Induction Type: IV induction ?Ventilation: Mask ventilation without difficulty ?LMA: LMA inserted ?LMA Size: 5.0 ?Number of attempts: 1 ?Airway Equipment and Method: Bite block ?Placement Confirmation: positive ETCO2, CO2 detector and breath sounds checked- equal and bilateral ?Tube secured with: Tape ?Dental Injury: Teeth and Oropharynx as per pre-operative assessment  ? ? ? ? ?

## 2022-02-05 NOTE — Anesthesia Preprocedure Evaluation (Addendum)
Anesthesia Evaluation  ?Patient identified by MRN, date of birth, ID band ?Patient awake ? ? ? ?Reviewed: ?Allergy & Precautions, NPO status , Patient's Chart, lab work & pertinent test results ? ?History of Anesthesia Complications ?Negative for: history of anesthetic complications ? ?Airway ?Mallampati: I ? ?TM Distance: >3 FB ?Neck ROM: Full ? ? ? Dental ? ?(+) Teeth Intact, Dental Advisory Given ?  ?Pulmonary ?neg pulmonary ROS,  ?  ?Pulmonary exam normal ? ? ? ? ? ? ? Cardiovascular ?negative cardio ROS ?Normal cardiovascular exam ? ? ?  ?Neuro/Psych ?negative neurological ROS ?   ? GI/Hepatic ?negative GI ROS, Neg liver ROS,   ?Endo/Other  ?negative endocrine ROS ? Renal/GU ?negative Renal ROS  ?negative genitourinary ?  ?Musculoskeletal ?negative musculoskeletal ROS ?(+)  ? Abdominal ?  ?Peds ? Hematology ?negative hematology ROS ?(+)   ?Anesthesia Other Findings ? ? Reproductive/Obstetrics ? ?  ? ? ? ? ? ? ? ? ? ? ? ? ? ?  ?  ? ? ? ? ? ? ? ?Anesthesia Physical ?Anesthesia Plan ? ?ASA: 1 ? ?Anesthesia Plan: General  ? ?Post-op Pain Management: Regional block* and Tylenol PO (pre-op)*  ? ?Induction: Intravenous ? ?PONV Risk Score and Plan: 2 and Ondansetron, Dexamethasone, Midazolam and Treatment may vary due to age or medical condition ? ?Airway Management Planned: LMA ? ?Additional Equipment: None ? ?Intra-op Plan:  ? ?Post-operative Plan: Extubation in OR ? ?Informed Consent: I have reviewed the patients History and Physical, chart, labs and discussed the procedure including the risks, benefits and alternatives for the proposed anesthesia with the patient or authorized representative who has indicated his/her understanding and acceptance.  ? ? ? ?Dental advisory given ? ?Plan Discussed with:  ? ?Anesthesia Plan Comments:   ? ? ? ? ? ?Anesthesia Quick Evaluation ? ?

## 2022-02-05 NOTE — Op Note (Signed)
? ?Date of Surgery: 02/05/2022 ? ?INDICATIONS: Mr. 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  The risk and benefits of the procedure were discussed in detail and documented in the pre-operative evaluation. ? ?PREOPERATIVE DIAGNOSIS: 1. Right ankle recurrent instability ?2. Peroneal tendon tenosynovitis ? ?POSTOPERATIVE DIAGNOSIS: Same. ? ?PROCEDURE: 1. Right ankle arthroscopy with limited debridement ?2. Right ankle Brostrom repair ?3. Right ankle peroneal tendon debridement ? ?SURGEON: Benancio Deeds MD ? ?ASSISTANT: Artis Flock ? ?ANESTHESIA:  general plus nerve block ? ?IV FLUIDS AND URINE: See anesthesia record. ? ?ANTIBIOTICS: Ancef 2g ? ?ESTIMATED BLOOD LOSS: 10 mL. ? ?IMPLANTS:  ?Implant Name Type Inv. Item Serial No. Manufacturer Lot No. LRB No. Used Action  ?IMPLANT INTERNAL BRACE BIO - [id number] Anchor IMPLANT INTERNAL [employer] [id number] Right 1 Implanted  ?ANCHOR KNOTLESS SUT DX #2 - [id number] Anchor ANCHOR KNOTLESS SUT DX #2  [employer] [id number] Right 2 Implanted  ? ? ?DRAINS: None ? ?CULTURES: None ? ?COMPLICATIONS: none ? ?DESCRIPTION OF PROCEDURE:  ?Diagnostic Arthroscopy: ?Tibia:  No anterior tibia osteophyte, medial malleolar osteophyte- debrided, normal articular cartilage surface   ?Talus:  No talar neck osteophytes, normal articular cartilage surface ?Lateral ligaments:  Significant synovitis in lateral gutter with ATFL laxity ?Medial ligaments:  Significant synovitis ?    ?Patient was identified in the preoperative holding area.  The correct site was noted and marked according to universal protocol with nursing.  Anesthesia subsequently performed a popliteal as well as abductor canal nerve block. ?  ?The patient was subsequently taken back to the operating room.  Anesthesia was induced.  All bony prominences were padded.  He was prepped and draped in the usual sterile fashion.  Again timeout was held.  Ancef was  given 1 hour prior to skin incision.  We began with the ankle arthroscopy.  Diagnostic arthroscopy began with a standard anterior medial incision. 20cc of normal saline was used to insufflate the joint. 11 blade was used to incise just through skin. Snap was used to spread down to the joint. The scope trocar was introduced.  Diagnostic arthroscopy ensued which showed overall healthy appearing cartilage involving the tibia and talus.  There was excessive laxity noted about the lateral gutter consistent with recurrent lateral ankle instability. There was significant synovitis in the lateral gutter. A shaver was introduced via an anterior medial portal and a limited debridement was performed of the anterior synovial inflammatory tissue.  Care was taken not to get superficial in this pretibial area. ?  ?Attention was then turned to the lateral Brostr?m approach. 15 blade was then used to incise through skin in the posterior lateral aspect of the fibula.  This was taken down to the level of bone.  A cuff of tissue was then released from the anterior lateral fibula.  The peroneal tendons were both inspected individually and there was a significant amount of synovitis that was removed with both tendons using a Metzenbaum scissors.  At this time the drill was used to place anchors in the anterior and posterior aspect of the fibula.  The suture component of this was then placed with a good bite through the AITF ligament and the knotless mechanism was each anchor was fed.  These were subsequently tensioned in tandem with the foot in dorsiflexion and eversion.  This showed excellent approximation of the tissue.  We then placed an internal brace first using the talar guide. This was placed onto the  talus.  We drilled tapped and then placed an anchor with internal brace.  The same procedure was then used on the fibula just proximal to our previous all suture anchors.  This showed excellent reduction of the anterior lateral ankle  with a subsequently negative anterior drawer negative talar tilt. ?  ?Wounds were thoroughly or irrigated.  0 Vicryl was used to approximate the posterior fibular tissue over the peroneal tendons.  The wound was closed with 2-0 Vicryl and 3-0 nylon for the skin and portals. ?  ? ?  ?  ?POSTOPERATIVE PLAN: The patient will be weight bearing as tolerated in a cam boot. I will see him back in 2 weeks for suture removal. He will begin PT postop. ? ? ? ?Benancio Deeds, MD ?2:30 PM ? ? ? ?

## 2022-02-05 NOTE — Interval H&P Note (Signed)
History and Physical Interval Note: ? ?02/05/2022 ?12:28 PM ? ?Lee Kelley  has presented today for surgery, with the diagnosis of right ankle recurrent instability.  The various methods of treatment have been discussed with the patient and family. After consideration of risks, benefits and other options for treatment, the patient has consented to  Procedure(s): ?RIGHT ANKLE ARTHROSCOPY WITH BROSTOM REPAIR (Right) as a surgical intervention.  The patient's history has been reviewed, patient examined, no change in status, stable for surgery.  I have reviewed the patient's chart and labs.  Questions were answered to the patient's satisfaction.   ? ? ?Huel Cote ? ? ?

## 2022-02-05 NOTE — Brief Op Note (Signed)
? ?  Brief Op Note ? ?Date of Surgery: ?02/05/2022 ? ?Preoperative Diagnosis: ?right ankle recurrent instability ? ?Postoperative Diagnosis: ?same ? ?Procedure: ?Procedure(s): ?RIGHT ANKLE ARTHROSCOPY WITH BROSTOM REPAIR ? ?Implants: ?Implant Name Type Inv. Item Serial No. Manufacturer Lot No. LRB No. Used Action  ?IMPLANT INTERNAL BRACE BIO - [id number] Anchor IMPLANT INTERNAL [employer] [id number] Right 1 Implanted  ?ANCHOR KNOTLESS SUT DX #2 - [id number] Anchor ANCHOR KNOTLESS SUT DX #2  [employer] [id number] Right 2 Implanted  ? ? ?Surgeons: ?Surgeon(s): ?Vanetta Mulders, MD ? ?Anesthesia: ?General ? ? ? ?Estimated Blood Loss: ?See anesthesia record ? ?Complications: ?None ? ?Condition to PACU: ?Stable ? ?Yevonne Pax, MD ?02/05/2022 ?2:30 PM ? ?

## 2022-02-05 NOTE — Addendum Note (Signed)
Addendum  created 02/05/22 1558 by [name], MD  ? Clinical Note Signed  ?  ?

## 2022-02-05 NOTE — Anesthesia Postprocedure Evaluation (Signed)
Anesthesia Post Note ? ?Patient: [name] ? ?Procedure(s) Performed: RIGHT ANKLE ARTHROSCOPY WITH BROSTOM REPAIR (Right: Ankle) ? ?  ? ?Patient location during evaluation: PACU ?Level of consciousness: awake and alert ?Pain management: pain level controlled ?Vital Signs Assessment: post-procedure vital signs reviewed and stable ?Respiratory status: spontaneous breathing, nonlabored ventilation and respiratory function stable ?Cardiovascular status: blood pressure returned to baseline and stable ?Postop Assessment: no apparent nausea or vomiting ?Anesthetic complications: no ? ? ?No notable events documented. ? ?Last Vitals:  ?Vitals:  ? 02/05/22 1445 02/05/22 1521  ?BP: 124/84 (!) 118/93  ?Pulse: 61 72  ?Resp: 11 18  ?Temp:  (!) 36.4 ?C  ?SpO2: 100% 97%  ?  ?Last Pain:  ?Vitals:  ? 02/05/22 1521  ?TempSrc: Oral  ?PainSc: 0-No pain  ? ? ?  ?  ?  ?  ?  ?  ? ?[name] ? ? ? ? ?

## 2022-02-05 NOTE — Progress Notes (Signed)
Assisted Dr. Witman with right, adductor canal, popliteal block. Side rails up, monitors on throughout procedure. See vital signs in flow sheet. Tolerated Procedure well. 

## 2022-02-06 ENCOUNTER — Encounter (HOSPITAL_BASED_OUTPATIENT_CLINIC_OR_DEPARTMENT_OTHER): Payer: Self-pay | Admitting: Orthopaedic Surgery

## 2022-02-08 ENCOUNTER — Ambulatory Visit (HOSPITAL_BASED_OUTPATIENT_CLINIC_OR_DEPARTMENT_OTHER): Payer: 59 | Attending: Orthopaedic Surgery | Admitting: Physical Therapy

## 2022-02-15 ENCOUNTER — Ambulatory Visit (HOSPITAL_BASED_OUTPATIENT_CLINIC_OR_DEPARTMENT_OTHER): Payer: 59 | Attending: Orthopaedic Surgery | Admitting: Physical Therapy

## 2022-02-15 ENCOUNTER — Encounter (HOSPITAL_BASED_OUTPATIENT_CLINIC_OR_DEPARTMENT_OTHER): Payer: Self-pay | Admitting: Physical Therapy

## 2022-02-15 DIAGNOSIS — M25571 Pain in right ankle and joints of right foot: Secondary | ICD-10-CM | POA: Diagnosis present

## 2022-02-15 DIAGNOSIS — R262 Difficulty in walking, not elsewhere classified: Secondary | ICD-10-CM | POA: Diagnosis present

## 2022-02-15 NOTE — Therapy (Signed)
?OUTPATIENT PHYSICAL THERAPY LOWER EXTREMITY EVALUATION ? ? ?Patient Name: Lee Kelley ?MRN: [id number] ?DOB:[birth year], 21 y.o., male ?Today's Date: 02/15/2022 ? ? PT End of Session - 02/15/22 1352   ? ? Visit Number 1   ? Number of Visits 21   ? Date for PT Re-Evaluation 04/26/22   ? Authorization Type [employer]   ? PT Start Time 1348   ? PT Stop Time 1426   ? PT Time Calculation (min) 38 min   ? Activity Tolerance Patient tolerated treatment well   ? Behavior During Therapy [facility] for tasks assessed/performed   ? ?  ?  ? ?  ? ? ?History reviewed. No pertinent past medical history. ?Past Surgical History:  ?Procedure Laterality Date  ? ANKLE ARTHROSCOPY WITH REPAIR SUBLUXING TENDON Right 02/05/2022  ? Procedure: RIGHT ANKLE ARTHROSCOPY WITH BROSTOM REPAIR;  Surgeon: Huel Cote, MD;  Location: [facility];  Service: Orthopedics;  Laterality: Right;  ? ?Patient Active Problem List  ? Diagnosis Date Noted  ? Right ankle instability   ? ? ?PCP: Pcp, No ? ?REFERRING PROVIDER: Huel Cote, MD ? ?REFERRING DIAG: M25.571 (ICD-10-CM) - Pain in joint involving right ankle and foot ? ?THERAPY DIAG:  ?Pain in right ankle and joints of right foot ? ?Difficulty in walking, not elsewhere classified ? ?ONSET DATE: Post op scheduled 3/27 R ankle Brostrom Procedure ? ?SUBJECTIVE:  ? ?SUBJECTIVE STATEMENT: ?Numbness in big toe a little. Wear the brace unless at home and just use scooter. Stopped taking pain meds, taking aspirin.  ? ?PERTINENT HISTORY: ?Chronic h/o ankle sprains bilaterally- no surgeries for left ? ?PAIN:  ?Are you having pain? No ? ?PRECAUTIONS: Other: ankle ? ?WEIGHT BEARING RESTRICTIONS WBAT, in boot until 4/17 ? ?FALLS:  ?Has patient fallen in last 6 months? No ? ?OCCUPATION: Education officer, environmental, work at [employer]  ? ?PLOF: Independent, was slightly limited prior to surgery due to swelling and pain from sprain ? ?PATIENT GOALS run, jump, basketball, stairs ? ? ?OBJECTIVE:  ? ?PATIENT SURVEYS:   ?LEFS 25 ? ?COGNITION: ? Overall cognitive status: Within functional limits for tasks assessed   ?  ?SENSATION: ?Some numbness in great toe ? ?MUSCLE LENGTH: ?Grossly limited in bil HS ? ?PALPATION: ?Denies TTP, mild pitting edema around lat malleolus consistent with post op  ? ?LE ROM: ? ?Active ROM Right ?02/15/2022 Left ?02/15/2022  ?Hip flexion    ?Hip extension    ?Hip abduction    ?Hip adduction    ?Hip internal rotation    ?Hip external rotation    ?Knee flexion    ?Knee extension    ?Ankle dorsiflexion    ?Ankle plantarflexion    ?Ankle inversion    ?Ankle eversion    ? (Blank rows = not tested) ? ?LE MMT: ? ?MMT Right ?02/15/2022 Left ?02/15/2022  ?Hip flexion    ?Hip extension    ?Hip abduction    ?Hip adduction    ?Hip internal rotation    ?Hip external rotation    ?Knee flexion    ?Knee extension    ?Ankle dorsiflexion    ?Ankle plantarflexion    ?Ankle inversion    ?Ankle eversion    ? (Blank rows = not tested) ? ? ?GAIT: ?Distance walked: using knee scooter, has crutches at home ? ? ? ? ?TODAY'S TREATMENT: ?EVAL:   ?PT changed bandages, no s/s of infection ?Standing weight shift lateral & in wide tandem ?Seated HSS ? ? ?PATIENT EDUCATION:  ?Education details:  Anatomy of condition, POC, HEP, exercise form/rationale ? ?Person educated: Patient ?Education method: Explanation, Demonstration, Tactile cues, Verbal cues, and Handouts ?Education comprehension: verbalized understanding, returned demonstration, verbal cues required, tactile cues required, and needs further education ? ? ?HOME EXERCISE PROGRAM: ?[id number] ? ?ASSESSMENT: ? ?CLINICAL IMPRESSION: ?Patient is a 21 y.o. M who was seen today for physical therapy evaluation and treatment for s/p Rt ankle Brostrum procedure to correct excessive laxity following multiple sprains.  ? ? ?OBJECTIVE IMPAIRMENTS Abnormal gait, decreased activity tolerance, difficulty walking, decreased ROM, decreased strength, increased edema, impaired flexibility, improper body  mechanics, and pain.  ? ?ACTIVITY LIMITATIONS cleaning, community activity, driving, meal prep, occupation, and shopping.  ? ?PERSONAL FACTORS 1 comorbidity: h/o multiple ankle injuries bilaterally  are also affecting patient's functional outcome.  ? ? ?REHAB POTENTIAL: Good ? ?CLINICAL DECISION MAKING: Stable/uncomplicated ? ?EVALUATION COMPLEXITY: Low ? ? ?GOALS: ?Goals reviewed with patient? Yes ? ?SHORT TERM GOALS: Target date: 03/15/2022 ? ?Ambulation in shoes without compensation ?Baseline: ?Goal status: INITIAL ? ?2.  SLS on stable surface with good control ?Baseline:  ?Goal status: INITIAL ? ?LONG TERM GOALS: Target date: 04/26/2022 ? ?LEFS to improve to 90% ?Baseline:  ?Goal status: INITIAL ? ?2.  Able to perform running without increased pain ?Baseline:  ?Goal status: INITIAL ? ?3.  Able to climb stairs with good control and necessary ROM ?Baseline:  ?Goal status: INITIAL ? ?4.  Begin return to sport training for basketball ?Baseline:  ?Goal status: INITIAL ? ? ?PLAN: ?PT FREQUENCY: 1-2x/week ? ?PT DURATION: 10 weeks ? ?PLANNED INTERVENTIONS: Therapeutic exercises, Therapeutic activity, Neuromuscular re-education, Balance training, Gait training, Patient/Family education, Joint mobilization, Stair training, Aquatic Therapy, Dry Needling, Electrical stimulation, Cryotherapy, Moist heat, scar mobilization, Taping, Vasopneumatic device, and Manual therapy ? ?PLAN FOR NEXT SESSION: progress to full WB without boot ? ?Lee Kelley C. Doshia Dalia PT, DPT ?02/15/22 8:22 PM ? ? ?

## 2022-02-19 ENCOUNTER — Encounter (HOSPITAL_BASED_OUTPATIENT_CLINIC_OR_DEPARTMENT_OTHER): Payer: 59 | Admitting: Orthopaedic Surgery

## 2022-02-20 ENCOUNTER — Encounter (HOSPITAL_BASED_OUTPATIENT_CLINIC_OR_DEPARTMENT_OTHER): Payer: Self-pay | Admitting: Orthopaedic Surgery

## 2022-02-20 NOTE — Progress Notes (Signed)
? ?                            ? ? ?Post Operative Evaluation ?  ? ?Procedure/Date of Surgery: 02/05/22 -right ankle arthroscopy with Brostr?m repair and peroneal tendon debridement ? ?Interval History:  ? ?Lee Kelley presents for follow-up 2 weeks status post the above procedure.  Overall he is doing extremely well.  He has been in his cam boot.  His pain has been minimal.  He is compliant with aspirin usage. ? ? ?PMH/PSH/Family History/Social History/Meds/Allergies:   ?No past medical history on file. ?Past Surgical History:  ?Procedure Laterality Date  ? ANKLE ARTHROSCOPY WITH REPAIR SUBLUXING TENDON Right 02/05/2022  ? Procedure: RIGHT ANKLE ARTHROSCOPY WITH BROSTOM REPAIR;  Surgeon: Huel Cote, MD;  [facility];  Service: Orthopedics;  Laterality: Right;  ? ?Social History  ? ?Socioeconomic History  ? Marital status: Single  ?  Spouse name: Not on file  ? Number of children: Not on file  ? Years of education: Not on file  ? Highest education level: Not on file  ?Occupational History  ? Not on file  ?Tobacco Use  ? Smoking status: Never  ? Smokeless tobacco: Never  ?Vaping Use  ? Vaping Use: Never used  ?Substance and Sexual Activity  ? Alcohol use: Never  ? Drug use: Never  ? Sexual activity: Not Currently  ?Other Topics Concern  ? Not on file  ?Social History Narrative  ? Not on file  ? ?Social Determinants of Health  ? ?Financial Resource Strain: Not on file  ?Food Insecurity: Not on file  ?Transportation Needs: Not on file  ?Physical Activity: Not on file  ?Stress: Not on file  ?Social Connections: Not on file  ? ?No family history on file. ?No Known Allergies ?Current Outpatient Medications  ?Medication Sig Dispense Refill  ? aspirin EC 325 MG tablet Take 1 tablet (325 mg total) by mouth daily. 30 tablet 0  ? calcium carbonate (OSCAL) 1500 (600 Ca) MG TABS tablet Take 1 tablet (1,500 mg total) by mouth daily with breakfast. 30 tablet 0  ? oxyCODONE (OXY IR/ROXICODONE) 5 MG immediate  release tablet Take 1 tablet (5 mg total) by mouth every 4 (four) hours as needed (severe pain). 10 tablet 0  ? Vitamin D, Ergocalciferol, (DRISDOL) 1.25 MG (50000 UNIT) CAPS capsule Take 1 capsule (50,000 Units total) by mouth every 7 (seven) days. 5 capsule 0  ? ?No current facility-administered medications for this visit.  ? ?No results found. ? ?Review of Systems:   ?A ROS was performed including pertinent positives and negatives as documented in the HPI. ? ? ?Musculoskeletal Exam:   ? ?There were no vitals taken for this visit. ? ?Right lateral ankle incision is well-healed.  There is no erythema or drainage.  He has minimal swelling.  He has minimal tenderness about his incisions.  He has range of motion to 15 degrees of dorsi and plantarflexion without pain.  2+ dorsalis pedis pulse. ? ?Imaging:   ? ?none ? ?I personally reviewed and interpreted the radiographs. ? ? ?Assessment:   ?2 weeks status post right ankle Brostr?m repair overall doing very well.  He will continue to advance per protocol.  He will be weightbearing as tolerated at this time.  He will wean out of his boot over the course of the next 2 weeks. ? ?Plan :   ? ?-Return to clinic in 4 weeks ? ? ? ? ? ?  I personally saw and evaluated the patient, and participated in the management and treatment plan. ? ?Lee Mulders, MD ?Attending Physician, Orthopedic Surgery ? ?This document was dictated using Systems analyst. A reasonable attempt at proof reading has been made to minimize errors. ?

## 2022-02-23 ENCOUNTER — Encounter (HOSPITAL_BASED_OUTPATIENT_CLINIC_OR_DEPARTMENT_OTHER): Payer: Self-pay | Admitting: Physical Therapy

## 2022-02-23 ENCOUNTER — Ambulatory Visit (HOSPITAL_BASED_OUTPATIENT_CLINIC_OR_DEPARTMENT_OTHER): Payer: 59 | Admitting: Physical Therapy

## 2022-02-23 DIAGNOSIS — M25571 Pain in right ankle and joints of right foot: Secondary | ICD-10-CM | POA: Diagnosis not present

## 2022-02-23 DIAGNOSIS — R262 Difficulty in walking, not elsewhere classified: Secondary | ICD-10-CM

## 2022-02-23 NOTE — Therapy (Signed)
?OUTPATIENT PHYSICAL THERAPY LOWER EXTREMITY [facility]/  ? ? ?Patient Name: Lee Kelley ?MRN: [id number] ?DOB:10-28-2001, 21 y.o., male ?Today's Date: 02/23/2022 ? ? PT End of Session - 02/23/22 1441   ? ? Visit Number 2   ? Number of Visits 21   ? Date for PT Re-Evaluation 04/26/22   ? Authorization Type [employer]et[employer]   ? PT Start Time 1440   Patient 10 min late  ? PT Stop Time 1515   ? PT Time Calculation (min) 35 min   ? Activity Tolerance Patient tolerated treatment well   ? Behavior During Therapy [facility] for tasks assessed/performed   ? ?  ?  ? ?  ? ? ?History reviewed. No pertinent past medical history. ?Past Surgical History:  ?Procedure Laterality Date  ? ANKLE ARTHROSCOPY WITH REPAIR SUBLUXING TENDON Right 02/05/2022  ? Procedure: RIGHT ANKLE ARTHROSCOPY WITH BROSTOM REPAIR;  Surgeon: Huel Cote, MD;  Location: [facility];  Service: Orthopedics;  Laterality: Right;  ? ?Patient Active Problem List  ? Diagnosis Date Noted  ? Right ankle instability   ? ? ?PCP: Pcp, No ? ?REFERRING PROVIDER: Huel Cote, MD ? ?REFERRING DIAG: M25.571 (ICD-10-CM) - Pain in joint involving right ankle and foot ? ?THERAPY DIAG:  ?Pain in right ankle and joints of right foot ? ?Difficulty in walking, not elsewhere classified ? ?ONSET DATE: Post op scheduled 3/27 R ankle Brostrom Procedure ? ?SUBJECTIVE:  ? ?SUBJECTIVE STATEMENT: ?Patient reports it is doing well. It is a little sore but overall pretty good.  ? ?PERTINENT HISTORY: ?Chronic h/o ankle sprains bilaterally- no surgeries for left ? ?PAIN:  ?Are you having pain? No ? ?PRECAUTIONS: Other: ankle ? ?WEIGHT BEARING RESTRICTIONS WBAT, in boot until 4/17 ? ?FALLS:  ?Has patient fallen in last 6 months? No ? ?OCCUPATION: Education officer, environmental, work at [employer]  ? ?PLOF: Independent, was slightly limited prior to surgery due to swelling and pain from sprain ? ?PATIENT GOALS run, jump, basketball, stairs ? ? ?OBJECTIVE:  ? ?PATIENT SURVEYS:  ?LEFS  25 ? ?COGNITION: ? Overall cognitive status: Within functional limits for tasks assessed   ?  ?SENSATION: ?Some numbness in great toe ? ?MUSCLE LENGTH: ?Grossly limited in bil HS ? ?PALPATION: ?Denies TTP, mild pitting edema around lat malleolus consistent with post op  ? ?LE ROM: ? ?Active ROM Right ?02/23/2022 Left ?02/23/2022  ?Hip flexion    ?Hip extension    ?Hip abduction    ?Hip adduction    ?Hip internal rotation    ?Hip external rotation    ?Knee flexion    ?Knee extension    ?Ankle dorsiflexion    ?Ankle plantarflexion    ?Ankle inversion    ?Ankle eversion    ? (Blank rows = not tested) ? ?LE MMT: ? ?MMT Right ?02/23/2022 Left ?02/23/2022  ?Hip flexion    ?Hip extension    ?Hip abduction    ?Hip adduction    ?Hip internal rotation    ?Hip external rotation    ?Knee flexion    ?Knee extension    ?Ankle dorsiflexion    ?Ankle plantarflexion    ?Ankle inversion    ?Ankle eversion    ? (Blank rows = not tested) ? ? ?GAIT: ?Distance walked: using knee scooter, has crutches at home ? ? ? ? ?TODAY'S TREATMENT: ?4/14 ?PROM into DF  ?Self stretching with nu-step 5 min  ? ?Seated with weight bearing through the heel windshield x20  ?Seated on air-ex with weight  bearing heel raise x20  ? ?Ankle DF red x20  ?Ankle PF x20 red  ? ?Standing weight shift forward x20  ?Standing march x20  ? ?SLR 3 way x20 3lbs  ? ? ?EVAL:   ?PT changed bandages, no s/s of infection ?Standing weight shift lateral & in wide tandem ?Seated HSS ? ? ?PATIENT EDUCATION:  ?Education details: Anatomy of condition, POC, HEP, exercise form/rationale ? ?Person educated: Patient ?Education method: Explanation, Demonstration, Tactile cues, Verbal cues, and Handouts ?Education comprehension: verbalized understanding, returned demonstration, verbal cues required, tactile cues required, and needs further education ? ? ?HOME EXERCISE PROGRAM: ?[id number] ? ?ASSESSMENT: ? ?CLINICAL IMPRESSION: ?The patient is doing very well. His motion is improving. We added  some resistance to DF/ PF exercises. He was able to do active EV/IV from a few positions. We also worked on gross LE strengthening. He performed his weigh bearing exercises with the boot on. On the 17th we can progress to a shoe. Therapy will continue to advance exercises as tolerated.  We will work on balance exercises next visit.  ? ?Patient is a 21 y.o. M who was seen today for physical therapy evaluation and treatment for s/p Rt ankle Brostrum procedure to correct excessive laxity following multiple sprains on .  ? ? ?OBJECTIVE IMPAIRMENTS Abnormal gait, decreased activity tolerance, difficulty walking, decreased ROM, decreased strength, increased edema, impaired flexibility, improper body mechanics, and pain.  ? ?ACTIVITY LIMITATIONS cleaning, community activity, driving, meal prep, occupation, and shopping.  ? ?PERSONAL FACTORS 1 comorbidity: h/o multiple ankle injuries bilaterally  are also affecting patient's functional outcome.  ? ? ?REHAB POTENTIAL: Good ? ?CLINICAL DECISION MAKING: Stable/uncomplicated ? ?EVALUATION COMPLEXITY: Low ? ? ?GOALS: ?Goals reviewed with patient? Yes ? ?SHORT TERM GOALS: Target date: 03/15/2022 ? ?Ambulation in shoes without compensation ?Baseline: ?Goal status: INITIAL ? ?2.  SLS on stable surface with good control ?Baseline:  ?Goal status: INITIAL ? ?LONG TERM GOALS: Target date: 04/26/2022 ? ?LEFS to improve to 90% ?Baseline:  ?Goal status: INITIAL ? ?2.  Able to perform running without increased pain ?Baseline:  ?Goal status: INITIAL ? ?3.  Able to climb stairs with good control and necessary ROM ?Baseline:  ?Goal status: INITIAL ? ?4.  Begin return to sport training for basketball ?Baseline:  ?Goal status: INITIAL ? ? ?PLAN: ?PT FREQUENCY: 1-2x/week ? ?PT DURATION: 10 weeks ? ?PLANNED INTERVENTIONS: Therapeutic exercises, Therapeutic activity, Neuromuscular re-education, Balance training, Gait training, Patient/Family education, Joint mobilization, Stair training, Aquatic  Therapy, Dry Needling, Electrical stimulation, Cryotherapy, Moist heat, scar mobilization, Taping, Vasopneumatic device, and Manual therapy ? ?PLAN FOR NEXT SESSION: progress to full WB without boot ? ? ?02/23/22 3:47 PM ? ? ?

## 2022-03-01 ENCOUNTER — Ambulatory Visit (HOSPITAL_BASED_OUTPATIENT_CLINIC_OR_DEPARTMENT_OTHER): Payer: 59 | Admitting: Physical Therapy

## 2022-03-01 ENCOUNTER — Encounter (HOSPITAL_BASED_OUTPATIENT_CLINIC_OR_DEPARTMENT_OTHER): Payer: Self-pay | Admitting: Physical Therapy

## 2022-03-01 DIAGNOSIS — R262 Difficulty in walking, not elsewhere classified: Secondary | ICD-10-CM

## 2022-03-01 DIAGNOSIS — M25571 Pain in right ankle and joints of right foot: Secondary | ICD-10-CM | POA: Diagnosis not present

## 2022-03-01 NOTE — Therapy (Signed)
?OUTPATIENT PHYSICAL THERAPY LOWER EXTREMITY [facility]/  ? ? ?Patient Name: Lee Kelley ?MRN: [id number] ?DOB:08/30/2001, 21 y.o., male ?Today's Date: 03/01/2022 ? ? PT End of Session - 03/01/22 1142   ? ? Visit Number 3   ? Number of Visits 21   ? Date for PT Re-Evaluation 04/26/22   ? Authorization Type Aetna   ? PT Start Time 1018   ? PT Stop Time 1056   ? PT Time Calculation (min) 38 min   ? Activity Tolerance Patient tolerated treatment well   ? Behavior During Therapy [facility] for tasks assessed/performed   ? ?  ?  ? ?  ? ? ? ?History reviewed. No pertinent past medical history. ?Past Surgical History:  ?Procedure Laterality Date  ? ANKLE ARTHROSCOPY WITH REPAIR SUBLUXING TENDON Right 02/05/2022  ? Procedure: RIGHT ANKLE ARTHROSCOPY WITH BROSTOM REPAIR;  Surgeon: Huel Cote, MD;  Location: [facility];  Service: Orthopedics;  Laterality: Right;  ? ?Patient Active Problem List  ? Diagnosis Date Noted  ? Right ankle instability   ? ? ?PCP: Pcp, No ? ?REFERRING PROVIDER: Huel Cote, MD ? ?REFERRING DIAG: M25.571 (ICD-10-CM) - Pain in joint involving right ankle and foot ? ?THERAPY DIAG:  ?Pain in right ankle and joints of right foot ? ?Difficulty in walking, not elsewhere classified ? ?ONSET DATE: Post op scheduled 3/27 R ankle Brostrom Procedure ? ?SUBJECTIVE:  ? ?SUBJECTIVE STATEMENT: ?Patient reports it is doing well but he has had some soreness in the ball of his foot since Monday. He thinks it is walking more  ? ?.  ?PERTINENT HISTORY: ?Chronic h/o ankle sprains bilaterally- no surgeries for left ? ?PAIN:  ?Are you having pain? No ? ?PRECAUTIONS: Other: ankle ? ?WEIGHT BEARING RESTRICTIONS WBAT, in boot until 4/17 ? ?FALLS:  ?Has patient fallen in last 6 months? No ? ?OCCUPATION: Education officer, environmental, work at [employer]  ? ?PLOF: Independent, was slightly limited prior to surgery due to swelling and pain from sprain ? ?PATIENT GOALS run, jump, basketball, stairs ? ? ?OBJECTIVE:   ? ?PATIENT SURVEYS:  ?LEFS 25 ? ?COGNITION: ? Overall cognitive status: Within functional limits for tasks assessed   ?  ?SENSATION: ?Some numbness in great toe ? ?MUSCLE LENGTH: ?Grossly limited in bil HS ? ?PALPATION: ?Denies TTP, mild pitting edema around lat malleolus consistent with post op  ? ?LE ROM: ? ?Active ROM Right ?03/01/2022 Left ?03/01/2022  ?Hip flexion    ?Hip extension    ?Hip abduction    ?Hip adduction    ?Hip internal rotation    ?Hip external rotation    ?Knee flexion    ?Knee extension    ?Ankle dorsiflexion    ?Ankle plantarflexion    ?Ankle inversion    ?Ankle eversion    ? (Blank rows = not tested) ? ?LE MMT: ? ?MMT Right ?03/01/2022 Left ?03/01/2022  ?Hip flexion    ?Hip extension    ?Hip abduction    ?Hip adduction    ?Hip internal rotation    ?Hip external rotation    ?Knee flexion    ?Knee extension    ?Ankle dorsiflexion    ?Ankle plantarflexion    ?Ankle inversion    ?Ankle eversion    ? (Blank rows = not tested) ? ? ?GAIT: ?Distance walked: using knee scooter, has crutches at home ? ? ? ? ? ?TODAY'S TREATMENT: ?4/20 ?PROM into DF  ?Self stretching on the nu-step in limited range  ?Ankle DF/PF x20 red  ? ? ?  Standing weight shift forward x20  ?Standing march x20  in shoe today ? ?Air-ex narrow base eyes closed with hands close for support 2x30 sec hold  ?Tandem stance 2x30 sec hold bilateral  ? ?Ex bike for range 5 min  ? ?4/14 ?PROM into DF  ?Self stretching with nu-step 5 min  ? ?Seated with weight bearing through the heel windshield x20  ?Seated on air-ex with weight bearing heel raise x20  ? ?Ankle DF red x20  ?Ankle PF x20 red  ? ?Standing weight shift forward x20  ?Standing march x20  ? ?SLR 3 way x20 3lbs  ? ? ?EVAL:   ?PT changed bandages, no s/s of infection ?Standing weight shift lateral & in wide tandem ?Seated HSS ? ? ?PATIENT EDUCATION:  ?Education details: Anatomy of condition, POC, HEP, exercise form/rationale ? ?Person educated: Patient ?Education method: Explanation,  Demonstration, Tactile cues, Verbal cues, and Handouts ?Education comprehension: verbalized understanding, returned demonstration, verbal cues required, tactile cues required, and needs further education ? ? ?HOME EXERCISE PROGRAM: ?[id number] ? ?ASSESSMENT: ? ?CLINICAL IMPRESSION: ?The patient is progressing well. He brought his shoe today. He tolerated weight shifting and low march well. We also began light instability training. He could feel it in his peroneals. His range is progressing well. He was shown how to stretch his toes and plantar fascia for the pain in the ball of his foot.  ? ? ?Patient is a 21 y.o. M who was seen today for physical therapy evaluation and treatment for s/p Rt ankle Brostrum procedure to correct excessive laxity following multiple sprains on .  ? ? ?OBJECTIVE IMPAIRMENTS Abnormal gait, decreased activity tolerance, difficulty walking, decreased ROM, decreased strength, increased edema, impaired flexibility, improper body mechanics, and pain.  ? ?ACTIVITY LIMITATIONS cleaning, community activity, driving, meal prep, occupation, and shopping.  ? ?PERSONAL FACTORS 1 comorbidity: h/o multiple ankle injuries bilaterally  are also affecting patient's functional outcome.  ? ? ?REHAB POTENTIAL: Good ? ?CLINICAL DECISION MAKING: Stable/uncomplicated ? ?EVALUATION COMPLEXITY: Low ? ? ?GOALS: ?Goals reviewed with patient? Yes ? ?SHORT TERM GOALS: Target date: 03/15/2022 ? ?Ambulation in shoes without compensation ?Baseline: ?Goal status: INITIAL ? ?2.  SLS on stable surface with good control ?Baseline:  ?Goal status: INITIAL ? ?LONG TERM GOALS: Target date: 04/26/2022 ? ?LEFS to improve to 90% ?Baseline:  ?Goal status: INITIAL ? ?2.  Able to perform running without increased pain ?Baseline:  ?Goal status: INITIAL ? ?3.  Able to climb stairs with good control and necessary ROM ?Baseline:  ?Goal status: INITIAL ? ?4.  Begin return to sport training for basketball ?Baseline:  ?Goal status:  INITIAL ? ? ?PLAN: ?PT FREQUENCY: 1-2x/week ? ?PT DURATION: 10 weeks ? ?PLANNED INTERVENTIONS: Therapeutic exercises, Therapeutic activity, Neuromuscular re-education, Balance training, Gait training, Patient/Family education, Joint mobilization, Stair training, Aquatic Therapy, Dry Needling, Electrical stimulation, Cryotherapy, Moist heat, scar mobilization, Taping, Vasopneumatic device, and Manual therapy ? ?PLAN FOR NEXT SESSION: progress to full WB without boot ? ? ?03/01/22 12:44 PM ? ? ?

## 2022-03-07 ENCOUNTER — Encounter (HOSPITAL_BASED_OUTPATIENT_CLINIC_OR_DEPARTMENT_OTHER): Payer: Self-pay | Admitting: Physical Therapy

## 2022-03-07 ENCOUNTER — Ambulatory Visit (HOSPITAL_BASED_OUTPATIENT_CLINIC_OR_DEPARTMENT_OTHER): Payer: 59 | Admitting: Physical Therapy

## 2022-03-07 DIAGNOSIS — R262 Difficulty in walking, not elsewhere classified: Secondary | ICD-10-CM

## 2022-03-07 DIAGNOSIS — M25571 Pain in right ankle and joints of right foot: Secondary | ICD-10-CM

## 2022-03-07 NOTE — Therapy (Signed)
?OUTPATIENT PHYSICAL THERAPY LOWER EXTREMITY [facility]/  ? ? ?Patient Name: Lee Kelley ?MRN: [id number] ?DOB:[birth year], 21 y.o., male ?Today's Date: 03/07/2022 ? ? PT End of Session - 03/07/22 1303   ? ? Visit Number 4   ? Number of Visits 21   ? Date for PT Re-Evaluation 04/26/22   ? Authorization Type Aetna   ? PT Start Time 1305   ? PT Stop Time 1339   ? PT Time Calculation (min) 34 min   ? Activity Tolerance Patient tolerated treatment well   ? Behavior During Therapy [facility] for tasks assessed/performed   ? ?  ?  ? ?  ? ? ? ?History reviewed. No pertinent past medical history. ?Past Surgical History:  ?Procedure Laterality Date  ? ANKLE ARTHROSCOPY WITH REPAIR SUBLUXING TENDON Right 02/05/2022  ? Procedure: RIGHT ANKLE ARTHROSCOPY WITH BROSTOM REPAIR;  Surgeon: Huel Cote, MD;  Location: [facility];  Service: Orthopedics;  Laterality: Right;  ? ?Patient Active Problem List  ? Diagnosis Date Noted  ? Right ankle instability   ? ? ?PCP: Pcp, No ? ?REFERRING PROVIDER: Huel Cote, MD ? ?REFERRING DIAG: M25.571 (ICD-10-CM) - Pain in joint involving right ankle and foot ? ?THERAPY DIAG:  ?Pain in right ankle and joints of right foot ? ?Difficulty in walking, not elsewhere classified ? ?ONSET DATE: Post op scheduled 3/27 R ankle Brostrom Procedure ? ?SUBJECTIVE:  ? ?SUBJECTIVE STATEMENT: ?Plantar fascia pain is gone. Just using the boot right now.  ? ?.  ?PERTINENT HISTORY: ?Chronic h/o ankle sprains bilaterally- no surgeries for left ? ?PAIN:  ?Are you having pain? No ? ?PRECAUTIONS: Other: ankle ? ?WEIGHT BEARING RESTRICTIONS WBAT, in boot until 4/17 ? ?FALLS:  ?Has patient fallen in last 6 months? No ? ?OCCUPATION: Education officer, environmental, work at [employer]  ? ?PLOF: Independent, was slightly limited prior to surgery due to swelling and pain from sprain ? ?PATIENT GOALS run, jump, basketball, stairs ? ? ?OBJECTIVE:  ? ?PATIENT SURVEYS:  ?LEFS 25 ? ?COGNITION: ? Overall cognitive status:  Within functional limits for tasks assessed   ?  ?SENSATION: ?Some numbness in great toe ? ?MUSCLE LENGTH: ?Grossly limited in bil HS ? ?PALPATION: ?Denies TTP, mild pitting edema around lat malleolus consistent with post op  ? ?LE ROM: ? ?Active ROM Right ?03/07/2022 Left ?03/07/2022  ?Hip flexion    ?Hip extension    ?Hip abduction    ?Hip adduction    ?Hip internal rotation    ?Hip external rotation    ?Knee flexion    ?Knee extension    ?Ankle dorsiflexion    ?Ankle plantarflexion    ?Ankle inversion    ?Ankle eversion    ? (Blank rows = not tested) ? ?LE MMT: ? ?MMT Right ?03/07/2022 Left ?03/07/2022  ?Hip flexion    ?Hip extension    ?Hip abduction    ?Hip adduction    ?Hip internal rotation    ?Hip external rotation    ?Knee flexion    ?Knee extension    ?Ankle dorsiflexion    ?Ankle plantarflexion    ?Ankle inversion    ?Ankle eversion    ? (Blank rows = not tested) ? ? ?GAIT: ?Distance walked: using knee scooter, has crutches at home ? ? ? ? ? ?TODAY'S TREATMENT: ?4/26: ?Fwd & retro stepping with heel<>toe ?SLS static ?SLS with knee pulls ?Heel raises ?SLS with knee ext from hip flexion, HS curls, large circles- done on both legs ?Lateral squat walks  at 4', mild speed push ?Toe walking ?Gastroc stretch standing ? ?Pt entered pool via  deep end rails ?Utilized bilateral handrails ?Depth up to 4'8" ? ?AquaticREHABdocumentation: Water will allow for reduced gait deviation due to reduced joint loading through buoyancy to help patient improve posture without excess stress and pain. , Water will aid with movement using the current and laminar flow while the buoyancy reduces weight bearing, Hydrostatic pressure also supports joints by unweighting joint load by at least 50 % in 3-4 feet depth water. 80% in chest to neck deep water., and Water current provides perturbations which challenge standing balance unsupported ? ? ? ?4/20 ?PROM into DF  ?Self stretching on the nu-step in limited range  ?Ankle DF/PF x20 red   ? ? ?Standing weight shift forward x20  ?Standing march x20  in shoe today ? ?Air-ex narrow base eyes closed with hands close for support 2x30 sec hold  ?Tandem stance 2x30 sec hold bilateral  ? ?Ex bike for range 5 min  ? ?4/14 ?PROM into DF  ?Self stretching with nu-step 5 min  ? ?Seated with weight bearing through the heel windshield x20  ?Seated on air-ex with weight bearing heel raise x20  ? ?Ankle DF red x20  ?Ankle PF x20 red  ? ?Standing weight shift forward x20  ?Standing march x20  ? ?SLR 3 way x20 3lbs  ? ? ? ?PATIENT EDUCATION:  ?Education details: Anatomy of condition, POC, HEP, exercise form/rationale ? ?Person educated: Patient ?Education method: Explanation, Demonstration, Tactile cues, Verbal cues, and Handouts ?Education comprehension: verbalized understanding, returned demonstration, verbal cues required, tactile cues required, and needs further education ? ? ?HOME EXERCISE PROGRAM: ?[id number] ? ?ASSESSMENT: ? ?CLINICAL IMPRESSION: ?Worked facing toward shallow end of pool for slight DF. Noodle required for balance in large circles when in Rt SLS. MD stated he can now safely be out of book and encouraged him to wear shoes for support. Will cont with aquatic rehab for the next 3 visits to continue challenging balance and gross strength. Noted some distal achilles pain when speed was increased in side stepping.  ? ? ?Patient is a 21 y.o. M, physical therapy evaluation and treatment for s/p Rt ankle Brostrum procedure to correct excessive laxity following multiple sprains on .  ? ? ?OBJECTIVE IMPAIRMENTS Abnormal gait, decreased activity tolerance, difficulty walking, decreased ROM, decreased strength, increased edema, impaired flexibility, improper body mechanics, and pain.  ? ?ACTIVITY LIMITATIONS cleaning, community activity, driving, meal prep, occupation, and shopping.  ? ?PERSONAL FACTORS 1 comorbidity: h/o multiple ankle injuries bilaterally  are also affecting patient's functional outcome.   ? ? ?REHAB POTENTIAL: Good ? ?CLINICAL DECISION MAKING: Stable/uncomplicated ? ?EVALUATION COMPLEXITY: Low ? ? ?GOALS: ?Goals reviewed with patient? Yes ? ?SHORT TERM GOALS: Target date: 03/15/2022 ? ?Ambulation in shoes without compensation ?Baseline: ?Goal status: INITIAL ? ?2.  SLS on stable surface with good control ?Baseline:  ?Goal status: INITIAL ? ?LONG TERM GOALS: Target date: 04/26/2022 ? ?LEFS to improve to 90% ?Baseline:  ?Goal status: INITIAL ? ?2.  Able to perform running without increased pain ?Baseline:  ?Goal status: INITIAL ? ?3.  Able to climb stairs with good control and necessary ROM ?Baseline:  ?Goal status: INITIAL ? ?4.  Begin return to sport training for basketball ?Baseline:  ?Goal status: INITIAL ? ? ?PLAN: ?PT FREQUENCY: 1-2x/week ? ?PT DURATION: 10 weeks ? ?PLANNED INTERVENTIONS: Therapeutic exercises, Therapeutic activity, Neuromuscular re-education, Balance training, Gait training, Patient/Family education, Joint mobilization, Stair training, Aquatic Therapy,  Dry Needling, Electrical stimulation, Cryotherapy, Moist heat, scar mobilization, Taping, Vasopneumatic device, and Manual therapy ? ?PLAN FOR NEXT SESSION: continue aquatic- balance challenges, large range strength ? ? ?Ariya Bohannon C. Labrina Lines PT, DPT ?03/07/22 2:27 PM ? ? ?

## 2022-03-14 ENCOUNTER — Ambulatory Visit (HOSPITAL_BASED_OUTPATIENT_CLINIC_OR_DEPARTMENT_OTHER): Payer: No Typology Code available for payment source | Attending: Orthopaedic Surgery | Admitting: Physical Therapy

## 2022-03-14 DIAGNOSIS — M25571 Pain in right ankle and joints of right foot: Secondary | ICD-10-CM | POA: Diagnosis present

## 2022-03-14 DIAGNOSIS — R262 Difficulty in walking, not elsewhere classified: Secondary | ICD-10-CM | POA: Diagnosis present

## 2022-03-14 NOTE — Therapy (Signed)
?OUTPATIENT PHYSICAL THERAPY LOWER EXTREMITY [facility]/  ? ? ?Patient Name: Lee Kelley ?MRN: [id number] ?DOB:[birth year], 21 y.o., male ?Today's Date: 03/14/2022 ? ? ? ? ? ?No past medical history on file. ?Past Surgical History:  ?Procedure Laterality Date  ? ANKLE ARTHROSCOPY WITH REPAIR SUBLUXING TENDON Right 02/05/2022  ? Procedure: RIGHT ANKLE ARTHROSCOPY WITH BROSTOM REPAIR;  Surgeon: Huel Cote, MD;  Location: [facility];  Service: Orthopedics;  Laterality: Right;  ? ?Patient Active Problem List  ? Diagnosis Date Noted  ? Right ankle instability   ? ? ?PCP: Pcp, No ? ?REFERRING PROVIDER: Huel Cote, MD ? ?REFERRING DIAG: M25.571 (ICD-10-CM) - Pain in joint involving right ankle and foot ? ?THERAPY DIAG:  ?No diagnosis found. ? ?ONSET DATE: Post op scheduled 3/27 R ankle Brostrom Procedure ? ?SUBJECTIVE:  ? ?SUBJECTIVE STATEMENT: ?Patien reports the motion is improving. He gets a little sore after treatments but overall he is doing well.  ? ?.  ?PERTINENT HISTORY: ?Chronic h/o ankle sprains bilaterally- no surgeries for left ? ?PAIN:  ?Are you having pain? No ? ?PRECAUTIONS: Other: ankle ? ?WEIGHT BEARING RESTRICTIONS WBAT, in boot until 4/17 ? ?FALLS:  ?Has patient fallen in last 6 months? No ? ?OCCUPATION: Education officer, environmental, work at [employer]  ? ?PLOF: Independent, was slightly limited prior to surgery due to swelling and pain from sprain ? ?PATIENT GOALS run, jump, basketball, stairs ? ? ?OBJECTIVE:  ? ?PATIENT SURVEYS:  ?LEFS 25 ? ?COGNITION: ? Overall cognitive status: Within functional limits for tasks assessed   ?  ?SENSATION: ?Some numbness in great toe ? ?MUSCLE LENGTH: ?Grossly limited in bil HS ? ?PALPATION: ?Denies TTP, mild pitting edema around lat malleolus consistent with post op  ? ?GAIT: ?Distance walked: using knee scooter, has crutches at home ? ? ? ? ? ?TODAY'S TREATMENT: ?5/4 ?Pt seen for aquatic therapy today.  Treatment took place in water 3.25-4 ft in  depth at the [facility] pool. Temp of water was 91?.  Pt entered/exited the pool via stairs (step through pattern) independently with bilat rail. ? Fwd & retro stepping with heel<>toe 2 laps  ?Side step with squat 2 laps  ?Lunging 2 laps  ? ?Heel raises x20  ? ?Step ups x20  ?Side steps x20  ?Step downs x20  ? ?Yellow weight switches fwd and lateral  ? ?Single leg bilateral shoulder extension x20  ?Bilateral shoulder futter x20  ? ?Fwd reach with yellow weight x20  ? ? ? ? ?Noodle push and pull x20 with core breathing; noodle press x20 with core breathing  ?Tandem Stance eyes open and eyes closed   ?Narrow base eyes open and eyes closed  ?Hamstring stretch 3x20 sec hold  ?Piriformis stretch 3x20 sec hold  ? ? ? ?Pt requires buoyancy for support and to offload joints with strengthening exercises. Viscosity of the water is needed for resistance of strengthening; water current perturbations provides challenge to standing balance unsupported, requiring increased core activation. ?4/26: ?Fwd & retro stepping with heel<>toe ?SLS static ?SLS with knee pulls ?Heel raises ?SLS with knee ext from hip flexion, HS curls, large circles- done on both legs ?Lateral squat walks at 4', mild speed push ?Toe walking ?Gastroc stretch standing ? ?Pt entered pool via  deep end rails ?Utilized bilateral handrails ?Depth up to 4'8" ? ?AquaticREHABdocumentation: Water will allow for reduced gait deviation due to reduced joint loading through buoyancy to help patient improve posture without excess stress and pain. , Water will aid  with movement using the current and laminar flow while the buoyancy reduces weight bearing, Hydrostatic pressure also supports joints by unweighting joint load by at least 50 % in 3-4 feet depth water. 80% in chest to neck deep water., and Water current provides perturbations which challenge standing balance unsupported ? ? ? ?4/20 ?PROM into DF  ?Self stretching on the nu-step in limited range  ?Ankle  DF/PF x20 red  ? ? ?Standing weight shift forward x20  ?Standing march x20  in shoe today ? ?Air-ex narrow base eyes closed with hands close for support 2x30 sec hold  ?Tandem stance 2x30 sec hold bilateral  ? ?Ex bike for range 5 min  ? ?4/14 ?PROM into DF  ?Self stretching with nu-step 5 min  ? ?Seated with weight bearing through the heel windshield x20  ?Seated on air-ex with weight bearing heel raise x20  ? ?Ankle DF red x20  ?Ankle PF x20 red  ? ?Standing weight shift forward x20  ?Standing march x20  ? ?SLR 3 way x20 3lbs  ? ? ? ?PATIENT EDUCATION:  ?Education details: benefits of aqua therapy  ? ?Person educated: Patient ?Education method: Explanation, Demonstration, Tactile cues, Verbal cues, and Handouts ?Education comprehension: verbalized understanding, returned demonstration, verbal cues required, tactile cues required, and needs further education ? ? ?HOME EXERCISE PROGRAM: ?[id number] ? ?ASSESSMENT: ? ?CLINICAL IMPRESSION: ?Therapy worked on movement activity, single leg activity and stairs int he pool today. He has some tightness at end range, but overall he is tolerating well. He was advised to continue working at home and we would continue to progress here.  ? ?Patient is a 21 y.o. M, physical therapy evaluation and treatment for s/p Rt ankle Brostrum procedure to correct excessive laxity following multiple sprains on .  ? ? ?OBJECTIVE IMPAIRMENTS Abnormal gait, decreased activity tolerance, difficulty walking, decreased ROM, decreased strength, increased edema, impaired flexibility, improper body mechanics, and pain.  ? ?ACTIVITY LIMITATIONS cleaning, community activity, driving, meal prep, occupation, and shopping.  ? ?PERSONAL FACTORS 1 comorbidity: h/o multiple ankle injuries bilaterally  are also affecting patient's functional outcome.  ? ? ?REHAB POTENTIAL: Good ? ?CLINICAL DECISION MAKING: Stable/uncomplicated ? ?EVALUATION COMPLEXITY: Low ? ? ?GOALS: ?Goals reviewed with patient? Yes ? ?SHORT  TERM GOALS: Target date: 03/15/2022 ? ?Ambulation in shoes without compensation ?Baseline: ?Goal status: INITIAL ? ?2.  SLS on stable surface with good control ?Baseline:  ?Goal status: INITIAL ? ?LONG TERM GOALS: Target date: 04/26/2022 ? ?LEFS to improve to 90% ?Baseline:  ?Goal status: INITIAL ? ?2.  Able to perform running without increased pain ?Baseline:  ?Goal status: INITIAL ? ?3.  Able to climb stairs with good control and necessary ROM ?Baseline:  ?Goal status: INITIAL ? ?4.  Begin return to sport training for basketball ?Baseline:  ?Goal status: INITIAL ? ? ?PLAN: ?PT FREQUENCY: 1-2x/week ? ?PT DURATION: 10 weeks ? ?PLANNED INTERVENTIONS: Therapeutic exercises, Therapeutic activity, Neuromuscular re-education, Balance training, Gait training, Patient/Family education, Joint mobilization, Stair training, Aquatic Therapy, Dry Needling, Electrical stimulation, Cryotherapy, Moist heat, scar mobilization, Taping, Vasopneumatic device, and Manual therapy ? ?PLAN FOR NEXT SESSION: continue aquatic- balance challenges, large range strength ? ? ?Lorayne Bender PT, DPT ?03/14/22 1:10 PM ? ? ?

## 2022-03-15 ENCOUNTER — Encounter (HOSPITAL_BASED_OUTPATIENT_CLINIC_OR_DEPARTMENT_OTHER): Payer: Self-pay | Admitting: Physical Therapy

## 2022-03-21 ENCOUNTER — Ambulatory Visit (HOSPITAL_BASED_OUTPATIENT_CLINIC_OR_DEPARTMENT_OTHER): Payer: No Typology Code available for payment source | Admitting: Physical Therapy

## 2022-03-21 ENCOUNTER — Encounter (HOSPITAL_BASED_OUTPATIENT_CLINIC_OR_DEPARTMENT_OTHER): Payer: Self-pay | Admitting: Physical Therapy

## 2022-03-21 DIAGNOSIS — M25571 Pain in right ankle and joints of right foot: Secondary | ICD-10-CM

## 2022-03-21 DIAGNOSIS — R262 Difficulty in walking, not elsewhere classified: Secondary | ICD-10-CM

## 2022-03-21 NOTE — Therapy (Signed)
?OUTPATIENT PHYSICAL THERAPY LOWER EXTREMITY [facility]/  ? ? ?Patient Name: Lee Kelley ?MRN: [id number] ?DOB:06-Aug-2001, 21 y.o., male ?Today's Date: 03/21/2022 ? ? PT End of Session - 03/21/22 1301   ? ? Visit Number 6   ? Number of Visits 21   ? Date for PT Re-Evaluation 04/26/22   ? Authorization Type Aetna   ? PT Start Time 1305   ? PT Stop Time 1345   ? PT Time Calculation (min) 40 min   ? Activity Tolerance Patient tolerated treatment well   ? [facility] for tasks assessed/performed   ? ?  ?  ? ?  ? ? ? ? ?History reviewed. No pertinent past medical history. ?Past Surgical History:  ?Procedure Laterality Date  ? ANKLE ARTHROSCOPY WITH REPAIR SUBLUXING TENDON Right 02/05/2022  ? Procedure: RIGHT ANKLE ARTHROSCOPY WITH BROSTOM REPAIR;  Surgeon: Huel CoteBokshan, Steven, MD;  Location: [facility];  Service: Orthopedics;  Laterality: Right;  ? ?Patient Active Problem List  ? Diagnosis Date Noted  ? Right ankle instability   ? ? ?PCP: Pcp, No ? ?REFERRING PROVIDER: Huel CoteSteven Bokshan, MD ? ?REFERRING DIAG: M25.571 (ICD-10-CM) - Pain in joint involving right ankle and foot ? ?THERAPY DIAG:  ?Pain in right ankle and joints of right foot ? ?Difficulty in walking, not elsewhere classified ? ?ONSET DATE: Post op scheduled 3/27 R ankle Brostrom Procedure ? ?SUBJECTIVE:  ? ?SUBJECTIVE STATEMENT: ?Pt reports he has been wearing regular shoes.  Biggest complaint is stiffness.  Not sure when his follow up with MD will be.  ? ?.  ?PERTINENT HISTORY: ?Chronic h/o ankle sprains bilaterally- no surgeries for left ? ?PAIN:  ?Are you having pain? No 0/10 ? ?PRECAUTIONS: Other: ankle ? ?WEIGHT BEARING RESTRICTIONS WBAT, in boot until 4/17 ? ?FALLS:  ?Has patient fallen in last 6 months? No ? ?OCCUPATION: Education officer, [employer] basketball player, work at [employer]  ? ?PLOF: Independent, was slightly limited prior to surgery due to swelling and pain from sprain ? ?PATIENT GOALS run, jump, basketball, stairs ? ? ?OBJECTIVE:   ? ?PATIENT SURVEYS:  ?LEFS 25 ? ?COGNITION: ? Overall cognitive status: Within functional limits for tasks assessed   ?  ?SENSATION: ?Some numbness in great toe ? ?MUSCLE LENGTH: ?Grossly limited in bil HS ? ?PALPATION: ?Denies TTP, mild pitting edema around lat malleolus consistent with post op  ? ?GAIT: ?Distance walked: using knee scooter, has crutches at home ? ? ? ? ? ?TODAY'S TREATMENT: ?5/10 ?Pt seen for aquatic therapy today.  Treatment took place in water 3.25-4.8 ft in depth at the [facility]. Temp of water was 91?.  Pt entered/exited the pool via stairs (step through pattern) independently with bilat rail. ? Fwd & retro stepping, with heel<>toe, and side stepping  3 laps  ?Side step with squat 2 laps  ?Rt heel / toe raises at 4.8 ft ?SLS with 3 way leg kicks holding yellow buoys x 10 ea LE, then LAQ with hip at ~50 deg flex ("kick a ball") x 10 ea LE ?Weight switches lateral x 20, yellow hand buoys under water ?Split squat forward and backward lunges x 2 laps each ?Heel / toe walking ?Forward step downs LLE, x 10 (in 4 ft);  straddling step R/L x 10 leading with each leg ?Forward step ups with opp high knee x 10 ?High knee marching, rolling through foot ?Hamstring stretch 20s x 2RLE, x1 LLE ?Soleus stretch x 20s x 2 RLE, 1 LLE ? ?5/4 ?Pt seen  for aquatic therapy today.  Treatment took place in water 3.25-4 ft in depth at the [facility] pool. Temp of water was 91?.  Pt entered/exited the pool via stairs (step through pattern) independently with bilat rail. ? Fwd & retro stepping with heel<>toe 2 laps  ?Side step with squat 2 laps  ?Lunging 2 laps  ?Heel raises x20  ?Step ups x20  ?Side steps x20  ?Step downs x20  ?Yellow weight switches fwd and lateral  ?Single leg bilateral shoulder extension x20  ?Bilateral shoulder futter x20 ?Fwd reach with yellow weight x20  ?Noodle push and pull x20 with core breathing; noodle press x20 with core breathing  ?Tandem Stance eyes open and eyes  closed   ?Narrow base eyes open and eyes closed  ?Hamstring stretch 3x20 sec hold  ?Piriformis stretch 3x20 sec hold  ? ?4/26: ?Fwd & retro stepping with heel<>toe ?SLS static ?SLS with knee pulls ?Heel raises ?SLS with knee ext from hip flexion, HS curls, large circles- done on both legs ?Lateral squat walks at 4', mild speed push ?Toe walking ?Gastroc stretch standing ? ?Pt entered pool via  deep end rails ?Utilized bilateral handrails ?Depth up to 4'8" ? ?AquaticREHABdocumentation: Water will allow for reduced gait deviation due to reduced joint loading through buoyancy to help patient improve posture without excess stress and pain. , Water will aid with movement using the current and laminar flow while the buoyancy reduces weight bearing, Hydrostatic pressure also supports joints by unweighting joint load by at least 50 % in 3-4 feet depth water. 80% in chest to neck deep water., and Water current provides perturbations which challenge standing balance unsupported ? ? ? ?4/20 ?PROM into DF  ?Self stretching on the nu-step in limited range  ?Ankle DF/PF x20 red  ?Standing weight shift forward x20  ?Standing march x20  in shoe today ?Air-ex narrow base eyes closed with hands close for support 2x30 sec hold  ?Tandem stance 2x30 sec hold bilateral  ?Ex bike for range 5 min  ? ?4/14 ?PROM into DF  ?Self stretching with nu-step 5 min ?Seated with weight bearing through the heel windshield x20  ?Seated on air-ex with weight bearing heel raise x20  ?Ankle DF red x20  ?Ankle PF x20 red  ?Standing weight shift forward x20  ?Standing march x20  ?SLR 3 way x20 3lbs  ? ? ? ?PATIENT EDUCATION:  ?Education details: benefits of aqua therapy  ? ?Person educated: Patient ?Education method: Explanation, Demonstration, Tactile cues, Verbal cues, and Handouts ?Education comprehension: verbalized understanding, returned demonstration, verbal cues required, tactile cues required, and needs further education ? ? ?HOME EXERCISE  PROGRAM: ?[id number];  ?Verbally added soleus stretch x 10s x 3 to HEP ? ?ASSESSMENT: ? ?CLINICAL IMPRESSION: ?Pt is 6 wk s/p R ankle Brostrom procedure.  Pt tolerated all exercises well, without any production of pain, only report of tightness.  He prefers to work in 58ft depth of water; he completed single leg heel raises at 4.8 ft without difficulty. Therapy worked on quality of ambulation, single leg activity and stairs in the pool today. He was advised to continue working at home and we would continue to progress here.  ? ?Patient is a 21 y.o. M, physical therapy evaluation and treatment for s/p Rt ankle Brostrum procedure to correct excessive laxity following multiple sprains on .  ? ? ?OBJECTIVE IMPAIRMENTS Abnormal gait, decreased activity tolerance, difficulty walking, decreased ROM, decreased strength, increased edema, impaired flexibility, improper body mechanics, and pain.  ? ?  ACTIVITY LIMITATIONS cleaning, community activity, driving, meal prep, occupation, and shopping.  ? ?PERSONAL FACTORS 1 comorbidity: h/o multiple ankle injuries bilaterally  are also affecting patient's functional outcome.  ? ? ?REHAB POTENTIAL: Good ? ?CLINICAL DECISION MAKING: Stable/uncomplicated ? ?EVALUATION COMPLEXITY: Low ? ? ?GOALS: ?Goals reviewed with patient? Yes ? ?SHORT TERM GOALS: Target date: 03/15/2022 ? ?Ambulation in shoes without compensation ?Baseline: ?Goal status: INITIAL ? ?2.  SLS on stable surface with good control ?Baseline:  ?Goal status: INITIAL ? ?LONG TERM GOALS: Target date: 04/26/2022 ? ?LEFS to improve to 90% ?Baseline:  ?Goal status: INITIAL ? ?2.  Able to perform running without increased pain ?Baseline:  ?Goal status: INITIAL ? ?3.  Able to climb stairs with good control and necessary ROM ?Baseline:  ?Goal status: INITIAL ? ?4.  Begin return to sport training for basketball ?Baseline:  ?Goal status: INITIAL ? ? ?PLAN: ?PT FREQUENCY: 1-2x/week ? ?PT DURATION: 10 weeks ? ?PLANNED INTERVENTIONS:  Therapeutic exercises, Therapeutic activity, Neuromuscular re-education, Balance training, Gait training, Patient/Family education, Joint mobilization, Stair training, Aquatic Therapy, Dry Needling, Electrical stimulation,

## 2022-03-23 ENCOUNTER — Ambulatory Visit (INDEPENDENT_AMBULATORY_CARE_PROVIDER_SITE_OTHER): Payer: No Typology Code available for payment source | Admitting: Orthopaedic Surgery

## 2022-03-23 DIAGNOSIS — M25571 Pain in right ankle and joints of right foot: Secondary | ICD-10-CM

## 2022-03-23 NOTE — Progress Notes (Signed)
? ?                            ? ? ?Post Operative Evaluation ?  ? ?Procedure/Date of Surgery: 02/05/22 -right ankle arthroscopy with Brostr?m repair and peroneal tendon debridement ? ?Interval History:  ? ?Lee Kelley presents for follow-up 6 weeks status post the above procedure.  He is experiencing some burning on the plantar aspect of the foot and that he is doing quite well.  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  Overall he feels very solid in terms of the ankle.  Denies any pain. ? ? ?PMH/PSH/Family History/Social History/Meds/Allergies:   ?No past medical history on file. ?Past Surgical History:  ?Procedure Laterality Date  ? ANKLE ARTHROSCOPY WITH REPAIR SUBLUXING TENDON Right 02/05/2022  ? Procedure: RIGHT ANKLE ARTHROSCOPY WITH BROSTOM REPAIR;  Surgeon: Huel Cote, MD;  Location: [facility];  Service: Orthopedics;  Laterality: Right;  ? ?Social History  ? ?Socioeconomic History  ? Marital status: Single  ?  Spouse name: Not on file  ? Number of children: Not on file  ? Years of education: Not on file  ? Highest education level: Not on file  ?Occupational History  ? Not on file  ?Tobacco Use  ? Smoking status: Never  ? Smokeless tobacco: Never  ?Vaping Use  ? Vaping Use: Never used  ?Substance and Sexual Activity  ? Alcohol use: Never  ? Drug use: Never  ? Sexual activity: Not Currently  ?Other Topics Concern  ? Not on file  ?Social History Narrative  ? Not on file  ? ?Social Determinants of Health  ? ?Financial Resource Strain: Not on file  ?Food Insecurity: Not on file  ?Transportation Needs: Not on file  ?Physical Activity: Not on file  ?Stress: Not on file  ?Social Connections: Not on file  ? ?No family history on file. ?No Known Allergies ?Current Outpatient Medications  ?Medication Sig Dispense Refill  ? aspirin EC 325 MG tablet Take 1 tablet (325 mg total) by mouth daily. 30 tablet 0  ? calcium carbonate (OSCAL) 1500 (600 Ca) MG TABS tablet Take 1 tablet  (1,500 mg total) by mouth daily with breakfast. 30 tablet 0  ? oxyCODONE (OXY IR/ROXICODONE) 5 MG immediate release tablet Take 1 tablet (5 mg total) by mouth every 4 (four) hours as needed (severe pain). 10 tablet 0  ? Vitamin D, Ergocalciferol, (DRISDOL) 1.25 MG (50000 UNIT) CAPS capsule Take 1 capsule (50,000 Units total) by mouth every 7 (seven) days. 5 capsule 0  ? ?No current facility-administered medications for this visit.  ? ?No results found. ? ?Review of Systems:   ?A ROS was performed including pertinent positives and negatives as documented in the HPI. ? ? ?Musculoskeletal Exam:   ? ?There were no vitals taken for this visit. ? ?Right lateral ankle incision is well-healed.  There is no erythema or drainage.  He has minimal swelling.  No pain with inversion or eversion of the ankle negative drawer as well as talar tilt.  2+ dorsalis pedis pulse. ? ?Imaging:   ? ?none ? ?I personally reviewed and interpreted the radiographs. ? ? ?Assessment:   ?6 weeks status post right ankle Brostr?m repair overall doing very well.  At this time I do believe he is ready for a strengthening program and he will ease back into basketball specific activities at this time. ? ?Plan :   ? ?-Return  to clinic in 4 weeks ? ? ? ? ? ?I personally saw and evaluated the patient, and participated in the management and treatment plan. ? ?Huel Cote, MD ?Attending Physician, Orthopedic Surgery ? ?This document was dictated using Conservation officer, historic buildings. A reasonable attempt at proof reading has been made to minimize errors. ?

## 2022-03-26 ENCOUNTER — Encounter (HOSPITAL_BASED_OUTPATIENT_CLINIC_OR_DEPARTMENT_OTHER): Payer: No Typology Code available for payment source | Admitting: Orthopaedic Surgery

## 2022-03-28 ENCOUNTER — Ambulatory Visit (HOSPITAL_BASED_OUTPATIENT_CLINIC_OR_DEPARTMENT_OTHER): Payer: No Typology Code available for payment source | Admitting: Physical Therapy

## 2022-03-28 ENCOUNTER — Encounter (HOSPITAL_BASED_OUTPATIENT_CLINIC_OR_DEPARTMENT_OTHER): Payer: Self-pay | Admitting: Physical Therapy

## 2022-03-28 DIAGNOSIS — M25571 Pain in right ankle and joints of right foot: Secondary | ICD-10-CM

## 2022-03-28 DIAGNOSIS — R262 Difficulty in walking, not elsewhere classified: Secondary | ICD-10-CM

## 2022-03-28 NOTE — Therapy (Signed)
?OUTPATIENT PHYSICAL THERAPY LOWER EXTREMITY [facility]/  ? ? ?Patient Name: Lee Kelley ?MRN: [id number] ?DOB:[birth year], 21 y.o., male ?Today's Date: 03/28/2022 ? ? ? ? ? ? ?No past medical history on file. ?Past Surgical History:  ?Procedure Laterality Date  ? ANKLE ARTHROSCOPY WITH REPAIR SUBLUXING TENDON Right 02/05/2022  ? Procedure: RIGHT ANKLE ARTHROSCOPY WITH BROSTOM REPAIR;  Surgeon: Vanetta Mulders, MD;  Location: [place];  Service: Orthopedics;  Laterality: Right;  ? ?Patient Active Problem List  ? Diagnosis Date Noted  ? Right ankle instability   ? ? ?PCP: Pcp, No ? ?REFERRING PROVIDER: Vanetta Mulders, MD ? ?REFERRING DIAG: M25.571 (ICD-10-CM) - Pain in joint involving right ankle and foot ? ?THERAPY DIAG:  ?No diagnosis found. ? ?ONSET DATE: Post op scheduled 3/27 R ankle Brostrom Procedure ? ?SUBJECTIVE:  ? ?SUBJECTIVE STATEMENT: ?Patient reports minor nerve pain at times. Per patient MD has cleared him and trainer for light jumping. He has no pain right now ? ?.  ?PERTINENT HISTORY: ?Chronic h/o ankle sprains bilaterally- no surgeries for left ? ?PAIN:  ?Are you having pain? No 0/10 5/17 ? ?PRECAUTIONS: Other: ankle ? ?WEIGHT BEARING RESTRICTIONS WBAT, in boot until 4/17 ? ?FALLS:  ?Has patient fallen in last 6 months? No ? ?OCCUPATION: Environmental education officer, work at [employer]  ? ?PLOF: Independent, was slightly limited prior to surgery due to swelling and pain from sprain ? ?PATIENT GOALS run, jump, basketball, stairs ? ? ?OBJECTIVE:  ? ?PATIENT SURVEYS:  ?LEFS 25 ? ?COGNITION: ? Overall cognitive status: Within functional limits for tasks assessed   ?  ?SENSATION: ?Some numbness in great toe ? ?MUSCLE LENGTH: ?Grossly limited in bil HS ? ?PALPATION: ?Denies TTP, mild pitting edema around lat malleolus consistent with post op  ? ?GAIT: ?Distance walked: using knee scooter, has crutches at home ? ? ? ? ? ?TODAY'S TREATMENT: ?5/17 ?Pt seen for aquatic therapy today.  Treatment  took place in water 3.25-4.8 ft in depth at the [facility] pool. Temp of water was 91?.  Pt entered/exited the pool via stairs (step through pattern) independently with bilat rail. ? Fwd & retro stepping, with heel<>toe, and side stepping  3 laps  ?Side step with squat 2 laps  ?Rt single leg heel raise 2x20 at 4.8 ft ?SLS with 3 way leg kicks holding yellow rainbow x 20 ea LE,  ?Weight switches lateral x 20, rainbow hand buoys under water ?Split squat forward and backward lunges x 2 laps each ?Heel / toe walking ?Tandom walking ddeep to shallow and back ? ?Single leg reach eyes open 2x15 eyes closed 2x15 with support  ? ?Low amplitude jump with quiet landing 2x20 ?Quick run with weight x30 each leg 3.5 lbs weight  ?Single leg left leg small circles x20 with weight  ?Single leg left leg full kicks x20 each leg  ? ?Single leg quic raning ?5/10 ?Pt seen for aquatic therapy today.  Treatment took place in water 3.25-4.8 ft in depth at the [facility] pool. Temp of water was 91?.  Pt entered/exited the pool via stairs (step through pattern) independently with bilat rail. ? Fwd & retro stepping, with heel<>toe, and side stepping  3 laps  ?Side step with squat 2 laps  ?Rt heel / toe raises at 4.8 ft ?SLS with 3 way leg kicks holding yellow buoys x 10 ea LE, then LAQ with hip at ~50 deg flex ("kick a ball") x 10 ea LE ?Weight switches lateral x 20, yellow  hand buoys under water ?Split squat forward and backward lunges x 2 laps each ?Heel / toe walking ?Forward step downs LLE, x 10 (in 4 ft);  straddling step R/L x 10 leading with each leg ?Forward step ups with opp high knee x 10 ?High knee marching, rolling through foot ?Hamstring stretch 20s x 2RLE, x1 LLE ?Soleus stretch x 20s x 2 RLE, 1 LLE ? ?5/4 ?Pt seen for aquatic therapy today.  Treatment took place in water 3.25-4 ft in depth at the [facility] pool. Temp of water was 91?.  Pt entered/exited the pool via stairs (step through pattern)  independently with bilat rail. ? Fwd & retro stepping with heel<>toe 2 laps  ?Side step with squat 2 laps  ?Lunging 2 laps  ?Heel raises x20  ?Step ups x20  ?Side steps x20  ?Step downs x20  ?Yellow weight switches fwd and lateral  ?Single leg bilateral shoulder extension x20  ?Bilateral shoulder futter x20 ?Fwd reach with yellow weight x20  ?Noodle push and pull x20 with core breathing; noodle press x20 with core breathing  ?Tandem Stance eyes open and eyes closed   ?Narrow base eyes open and eyes closed  ?Hamstring stretch 3x20 sec hold  ?Piriformis stretch 3x20 sec hold  ? ?4/26: ?Fwd & retro stepping with heel<>toe ?SLS static ?SLS with knee pulls ?Heel raises ?SLS with knee ext from hip flexion, HS curls, large circles- done on both legs ?Lateral squat walks at 4', mild speed push ?Toe walking ?Gastroc stretch standing ? ?Pt entered pool via  deep end rails ?Utilized bilateral handrails ?Depth up to 4'8" ? ?AquaticREHABdocumentation: Water will allow for reduced gait deviation due to reduced joint loading through buoyancy to help patient improve posture without excess stress and pain. , Water will aid with movement using the current and laminar flow while the buoyancy reduces weight bearing, Hydrostatic pressure also supports joints by unweighting joint load by at least 50 % in 3-4 feet depth water. 80% in chest to neck deep water., and Water current provides perturbations which challenge standing balance unsupported ? ? ? ?4/20 ?PROM into DF  ?Self stretching on the nu-step in limited range  ?Ankle DF/PF x20 red  ?Standing weight shift forward x20  ?Standing march x20  in shoe today ?Air-ex narrow base eyes closed with hands close for support 2x30 sec hold  ?Tandem stance 2x30 sec hold bilateral  ?Ex bike for range 5 min  ? ?4/14 ?PROM into DF  ?Self stretching with nu-step 5 min ?Seated with weight bearing through the heel windshield x20  ?Seated on air-ex with weight bearing heel raise x20  ?Ankle DF red x20   ?Ankle PF x20 red  ?Standing weight shift forward x20  ?Standing march x20  ?SLR 3 way x20 3lbs  ? ? ? ?PATIENT EDUCATION:  ?Education details: benefits of aqua therapy  ? ?Person educated: Patient ?Education method: Explanation, Demonstration, Tactile cues, Verbal cues, and Handouts ?Education comprehension: verbalized understanding, returned demonstration, verbal cues required, tactile cues required, and needs further education ? ? ?HOME EXERCISE PROGRAM: ?[id number];  ?Verbally added soleus stretch x 10s x 3 to HEP ? ?ASSESSMENT: ? ?CLINICAL IMPRESSION: ?The patient is making great progress. We trialed light jumping in the pool today without any pain. He focused mostly on single leg activity in the pool. He is doing really well. He will see the MD on Monday in the training room. We will continue to progress movement activity as tolerated.  ? ?OBJECTIVE IMPAIRMENTS  Abnormal gait, decreased activity tolerance, difficulty walking, decreased ROM, decreased strength, increased edema, impaired flexibility, improper body mechanics, and pain.  ? ?ACTIVITY LIMITATIONS cleaning, community activity, driving, meal prep, occupation, and shopping.  ? ?PERSONAL FACTORS 1 comorbidity: h/o multiple ankle injuries bilaterally  are also affecting patient's functional outcome.  ? ? ?REHAB POTENTIAL: Good ? ?CLINICAL DECISION MAKING: Stable/uncomplicated ? ?EVALUATION COMPLEXITY: Low ? ? ?GOALS: ?Goals reviewed with patient? Yes ? ?SHORT TERM GOALS: Target date: 03/15/2022 ? ?Ambulation in shoes without compensation ?Baseline: ?Goal status: INITIAL ? ?2.  SLS on stable surface with good control ?Baseline:  ?Goal status: INITIAL ? ?LONG TERM GOALS: Target date: 04/26/2022 ? ?LEFS to improve to 90% ?Baseline:  ?Goal status: INITIAL ? ?2.  Able to perform running without increased pain ?Baseline:  ?Goal status: INITIAL ? ?3.  Able to climb stairs with good control and necessary ROM ?Baseline:  ?Goal status: INITIAL ? ?4.  Begin return to  sport training for basketball ?Baseline:  ?Goal status: INITIAL ? ? ?PLAN: ?PT FREQUENCY: 1-2x/week ? ?PT DURATION: 10 weeks ? ?PLANNED INTERVENTIONS: Therapeutic exercises, Therapeutic activity, Neuromuscu

## 2022-04-05 ENCOUNTER — Ambulatory Visit (HOSPITAL_BASED_OUTPATIENT_CLINIC_OR_DEPARTMENT_OTHER): Payer: No Typology Code available for payment source | Admitting: Physical Therapy

## 2022-04-05 ENCOUNTER — Encounter (HOSPITAL_BASED_OUTPATIENT_CLINIC_OR_DEPARTMENT_OTHER): Payer: Self-pay | Admitting: Physical Therapy

## 2022-04-05 DIAGNOSIS — M25571 Pain in right ankle and joints of right foot: Secondary | ICD-10-CM

## 2022-04-05 DIAGNOSIS — R262 Difficulty in walking, not elsewhere classified: Secondary | ICD-10-CM

## 2022-04-05 NOTE — Therapy (Signed)
OUTPATIENT PHYSICAL THERAPY LOWER EXTREMITY [facility]/    Patient Name: Lee Kelley MRN: [id number] DOB:02-06-2001, 21 y.o., male Today's Date: 04/05/2022   PT End of Session - 04/05/22 1556     Visit Number 8    Number of Visits 21    Date for PT Re-Evaluation 04/26/22    Authorization Type [employer]    PT Start Time 1520    PT Stop Time 1555    PT Time Calculation (min) 35 min    Activity Tolerance Patient tolerated treatment well    Behavior During Therapy [facility] for tasks assessed/performed                History reviewed. No pertinent past medical history. Past Surgical History:  Procedure Laterality Date   ANKLE ARTHROSCOPY WITH REPAIR SUBLUXING TENDON Right 02/05/2022   Procedure: RIGHT ANKLE ARTHROSCOPY WITH BROSTOM REPAIR;  Surgeon: Huel Cote, MD;  Location: [facility];  Service: Orthopedics;  Laterality: Right;   Patient Active Problem List   Diagnosis Date Noted   Right ankle instability     PCP: Pcp, No  REFERRING PROVIDER: Huel Cote, MD  REFERRING DIAG: [phone] (ICD-10-CM) - Pain in joint involving right ankle and foot  THERAPY DIAG:  Pain in right ankle and joints of right foot  Difficulty in walking, not elsewhere classified  ONSET DATE: Post op scheduled 3/27 R ankle Brostrom Procedure  SUBJECTIVE:   SUBJECTIVE STATEMENT: Patient reports minor nerve pain at times. Per patient MD has cleared him and trainer for light jumping. He has no pain right now  .  PERTINENT HISTORY: Chronic h/o ankle sprains bilaterally- no surgeries for left  PAIN:  Are you having pain? No 0/10 5/17  PRECAUTIONS: Other: ankle  WEIGHT BEARING RESTRICTIONS WBAT, in boot until 4/17  FALLS:  Has patient fallen in last 6 months? 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, was slightly limited prior to surgery due to swelling and pain from sprain  PATIENT GOALS run, jump, basketball,  stairs   OBJECTIVE:   PATIENT SURVEYS:  LEFS 25  COGNITION:  Overall cognitive status: Within functional limits for tasks assessed     SENSATION: Some numbness in great toe  MUSCLE LENGTH: Grossly limited in bil HS  PALPATION: Denies TTP, mild pitting edema around lat malleolus consistent with post op   GAIT: Distance walked: using knee scooter, has crutches at home      TODAY'S TREATMENT: 5/25:  Gastroc stretch SLS with UE ABCs holding 2lb weight- watching weight Single leg trunk rotation anchored resistance Small plyometrics- lateral, single leg landing, flat foot & in PF landing Wall sit with Heel lifts Light jogging Toe walking Deep squat stretch for DF flexibility MANUAL- IASTM Rt gastroc  5/17 Pt seen for aquatic therapy today.  Treatment took place in water 3.25-4.8 ft in depth at the [facility] pool. Temp of water was 91.  Pt entered/exited the pool via stairs (step through pattern) independently with bilat rail.  Fwd & retro stepping, with heel<>toe, and side stepping  3 laps  Side step with squat 2 laps  Rt single leg heel raise 2x20 at 4.8 ft SLS with 3 way leg kicks holding yellow rainbow x 20 ea LE,  Weight switches lateral x 20, rainbow hand buoys under water Split squat forward and backward lunges x 2 laps each Heel / toe walking Tandom walking ddeep to shallow and back  Single leg reach eyes open 2x15 eyes  closed 2x15 with support   Low amplitude jump with quiet landing 2x20 Quick run with weight x30 each leg 3.5 lbs weight  Single leg left leg small circles x20 with weight  Single leg left leg full kicks x20 each leg   Single leg quic raning  5/10 Pt seen for aquatic therapy today.  Treatment took place in water 3.25-4.8 ft in depth at the [facility] pool. Temp of water was 91.  Pt entered/exited the pool via stairs (step through pattern) independently with bilat rail.  Fwd & retro stepping, with heel<>toe, and side  stepping  3 laps  Side step with squat 2 laps  Rt heel / toe raises at 4.8 ft SLS with 3 way leg kicks holding yellow buoys x 10 ea LE, then LAQ with hip at ~50 deg flex ("kick a ball") x 10 ea LE Weight switches lateral x 20, yellow hand buoys under water Split squat forward and backward lunges x 2 laps each Heel / toe walking Forward step downs LLE, x 10 (in 4 ft);  straddling step R/L x 10 leading with each leg Forward step ups with opp high knee x 10 High knee marching, rolling through foot Hamstring stretch 20s x 2RLE, x1 LLE Soleus stretch x 20s x 2 RLE, 1 LLE     PATIENT EDUCATION:  Education details: benefits of aqua therapy   Person educated: Patient Education method: Explanation, Demonstration, Tactile cues, Verbal cues, and Handouts Education comprehension: verbalized understanding, returned demonstration, verbal cues required, tactile cues required, and needs further education   HOME EXERCISE PROGRAM: [id number];  Verbally added soleus stretch x 10s x 3 to HEP Wall sit with heel raises, toe walking  ASSESSMENT:  CLINICAL IMPRESSION: Less control in controlled weight acceptance on Rt foot in jogging than Lt. Some pain noted post to lateral maleolus in toe walking indicating need for further strengthening to support RTS. Excellent balance without visual disturbance but was very challenged by added eyes following the weight in ABCs.   OBJECTIVE IMPAIRMENTS Abnormal gait, decreased activity tolerance, difficulty walking, decreased ROM, decreased strength, increased edema, impaired flexibility, improper body mechanics, and pain.   ACTIVITY LIMITATIONS cleaning, community activity, driving, meal prep, occupation, and shopping.   PERSONAL FACTORS 1 comorbidity: h/o multiple ankle injuries bilaterally  are also affecting patient's functional outcome.    REHAB POTENTIAL: Good  CLINICAL DECISION MAKING: Stable/uncomplicated  EVALUATION COMPLEXITY: Low   GOALS: Goals  reviewed with patient? Yes  SHORT TERM GOALS: Target date: 03/15/2022  Ambulation in shoes without compensation Baseline: Goal status: achieved  2.  SLS on stable surface with good control Baseline:  Goal status: achieved  LONG TERM GOALS: Target date: 04/26/2022  LEFS to improve to 90% Baseline:  Goal status: INITIAL  2.  Able to perform running without increased pain Baseline:  Goal status: INITIAL  3.  Able to climb stairs with good control and necessary ROM Baseline:  Goal status: INITIAL  4.  Begin return to sport training for basketball Baseline:  Goal status: INITIAL   PLAN: PT FREQUENCY: 1-2x/week  PT DURATION: 10 weeks  PLANNED INTERVENTIONS: Therapeutic exercises, Therapeutic activity, Neuromuscular re-education, Balance training, Gait training, Patient/Family education, Joint mobilization, Stair training, Aquatic Therapy, Dry Needling, Electrical stimulation, Cryotherapy, Moist heat, scar mobilization, Taping, Vasopneumatic device, and Manual therapy  PLAN FOR NEXT SESSION: cont balance with visual disturbance, gastroc endurance   Jacynda Brunke C. Quinnie Barcelo PT, DPT 04/05/22 3:58 PM

## 2022-04-11 ENCOUNTER — Ambulatory Visit (HOSPITAL_BASED_OUTPATIENT_CLINIC_OR_DEPARTMENT_OTHER): Payer: No Typology Code available for payment source | Admitting: Physical Therapy

## 2022-04-18 ENCOUNTER — Ambulatory Visit (HOSPITAL_BASED_OUTPATIENT_CLINIC_OR_DEPARTMENT_OTHER): Payer: No Typology Code available for payment source | Attending: Orthopaedic Surgery | Admitting: Physical Therapy

## 2022-04-18 DIAGNOSIS — M25571 Pain in right ankle and joints of right foot: Secondary | ICD-10-CM | POA: Diagnosis present

## 2022-04-18 DIAGNOSIS — R262 Difficulty in walking, not elsewhere classified: Secondary | ICD-10-CM | POA: Diagnosis present

## 2022-04-18 NOTE — Therapy (Addendum)
OUTPATIENT PHYSICAL THERAPY LOWER EXTREMITY TREATMENT/DISCHARGE   Patient Name: Lee Kelley MRN: [id number] DOB:06-19-2001, 21 y.o., male Today's Date: 04/19/2022   PT End of Session - 04/18/22 1418     Visit Number 9    Number of Visits 21    Date for PT Re-Evaluation 04/26/22    Authorization Type [employer]    PT Start Time 1345    PT Stop Time 1418   only needed time givn to go over his program   PT Time Calculation (min) 33 min    Activity Tolerance Patient tolerated treatment well    Behavior During Therapy [facility] for tasks assessed/performed                 No past medical history on file. Past Surgical History:  Procedure Laterality Date   ANKLE ARTHROSCOPY WITH REPAIR SUBLUXING TENDON Right 02/05/2022   Procedure: RIGHT ANKLE ARTHROSCOPY WITH BROSTOM REPAIR;  Surgeon: Huel Cote, MD;  Location: [facility];  Service: Orthopedics;  Laterality: Right;   Patient Active Problem List   Diagnosis Date Noted   Right ankle instability     PCP: Pcp, No  REFERRING PROVIDER: Huel Cote, MD  REFERRING DIAG: [id number] (ICD-10-CM) - Pain in joint involving right ankle and foot  THERAPY DIAG:  Pain in right ankle and joints of right foot  Difficulty in walking, not elsewhere classified  ONSET DATE: Post op scheduled 3/27 R ankle Brostrom Procedure  SUBJECTIVE:   SUBJECTIVE STATEMENT: Patient reports foot/ankle has been feeling great; no recent nerve pain. Pt has been performing light basketball drills and jogging with trainer without any issues. PT  anticipates discharge and will continue return to sports with team trainers. PERTINENT HISTORY: Chronic h/o ankle sprains bilaterally- no surgeries for left  PAIN:  Are you having pain? No 0/10 5/17  PRECAUTIONS: Other: ankle  WEIGHT BEARING RESTRICTIONS WBAT, in boot until 4/17  FALLS:  Has patient fallen in last 6 months? 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, work at [employer]:  Independent, was slightly limited prior to surgery due to swelling and pain from sprain  PATIENT GOALS run, jump, basketball, stairs   OBJECTIVE:   PATIENT SURVEYS:  LEFS 25  COGNITION:  Overall cognitive status: Within functional limits for tasks assessed     SENSATION: Some numbness in great toe  MUSCLE LENGTH: Grossly limited in bil HS  PALPATION: Denies TTP, mild pitting edema around lat malleolus consistent with post op   GAIT: Distance walked: using knee scooter, has crutches at home   ANKLE ROM  R DF: 20deg L DF: 5deg  LE MMT:   MMT Right 6/72023 Left 04/18/2022  Hip flexion      Hip extension      Hip abduction      Hip adduction      Hip internal rotation      Hip external rotation      Knee flexion      Knee extension      Ankle dorsiflexion  25 23   Ankle plantarflexion  58 53   Ankle inversion  17.8  13.3  Ankle eversion  17.2 17.3    (Blank rows = not tested)   TODAY'S TREATMENT:  6/7  Warmup - upright bike x5 min  Jumping program x30sec each -Squat jumps -Bounding -Broad jumps w/5sec hold -180 jumps   Range/strength testing  Reviewed final POC    5/25:  Gastroc stretch SLS with UE ABCs holding 2lb  weight- watching weight Single leg trunk rotation anchored resistance Small plyometrics- lateral, single leg landing, flat foot & in PF landing Wall sit with Heel lifts Light jogging Toe walking Deep squat stretch for DF flexibility MANUAL- IASTM Rt gastroc  5/17 Pt seen for aquatic therapy today.  Treatment took place in water 3.25-4.8 ft in depth at the [facility] pool. Temp of water was 91.  Pt entered/exited the pool via stairs (step through pattern) independently with bilat rail.  Fwd & retro stepping, with heel<>toe, and side stepping  3 laps  Side step with squat 2 laps  Rt single leg heel raise 2x20 at 4.8 ft SLS with 3 way leg kicks holding yellow rainbow x 20 ea LE,  Weight switches lateral x 20, rainbow  hand buoys under water Split squat forward and backward lunges x 2 laps each Heel / toe walking Tandom walking ddeep to shallow and back  Single leg reach eyes open 2x15 eyes closed 2x15 with support   Low amplitude jump with quiet landing 2x20 Quick run with weight x30 each leg 3.5 lbs weight  Single leg left leg small circles x20 with weight  Single leg left leg full kicks x20 each leg   Single leg quic raning  5/10 Pt seen for aquatic therapy today.  Treatment took place in water 3.25-4.8 ft in depth at the [facility] pool. Temp of water was 91.  Pt entered/exited the pool via stairs (step through pattern) independently with bilat rail.  Fwd & retro stepping, with heel<>toe, and side stepping  3 laps  Side step with squat 2 laps  Rt heel / toe raises at 4.8 ft SLS with 3 way leg kicks holding yellow buoys x 10 ea LE, then LAQ with hip at ~50 deg flex ("kick a ball") x 10 ea LE Weight switches lateral x 20, yellow hand buoys under water Split squat forward and backward lunges x 2 laps each Heel / toe walking Forward step downs LLE, x 10 (in 4 ft);  straddling step R/L x 10 leading with each leg Forward step ups with opp high knee x 10 High knee marching, rolling through foot Hamstring stretch 20s x 2RLE, x1 LLE Soleus stretch x 20s x 2 RLE, 1 LLE     PATIENT EDUCATION:  Education details: benefits of aqua therapy   Person educated: Patient Education method: Explanation, Demonstration, Tactile cues, Verbal cues, and Handouts Education comprehension: verbalized understanding, returned demonstration, verbal cues required, tactile cues required, and needs further education   HOME EXERCISE PROGRAM: [id number];  Verbally added soleus stretch x 10s x 3 to HEP Wall sit with heel raises, toe walking  ASSESSMENT:  CLINICAL IMPRESSION: Pt tolerates jumping program well - able to demonstrate proper landing mechanics on all jump with even weight acceptance and minimal  medial/lateral deviation. No pain noted during. Pt will be discharged and continue return to sport under care of team providers.  OBJECTIVE IMPAIRMENTS Abnormal gait, decreased activity tolerance, difficulty walking, decreased ROM, decreased strength, increased edema, impaired flexibility, improper body mechanics, and pain.   ACTIVITY LIMITATIONS cleaning, community activity, driving, meal prep, occupation, and shopping.   PERSONAL FACTORS 1 comorbidity: h/o multiple ankle injuries bilaterally  are also affecting patient's functional outcome.    REHAB POTENTIAL: Good  CLINICAL DECISION MAKING: Stable/uncomplicated  EVALUATION COMPLEXITY: Low   GOALS: Goals reviewed with patient? Yes  SHORT TERM GOALS: Target date: 03/15/2022  Ambulation in shoes without compensation Baseline: Goal status: achieved  2.  SLS on stable surface with good control Baseline:  Goal status: achieved  LONG TERM GOALS: Target date: 04/26/2022  LEFS to improve to 90% Baseline:  Goal status: INITIAL  2.  Able to perform running without increased pain Baseline:  Goal status: Achieved  3.  Able to climb stairs with good control and necessary ROM Baseline:  Goal status: Achieved  4.  Begin return to sport training for basketball Baseline:  Goal status: Achieved   PLAN: PT FREQUENCY: 1-2x/week  PT DURATION: 10 weeks  PLANNED INTERVENTIONS: Therapeutic exercises, Therapeutic activity, Neuromuscular re-education, Balance training, Gait training, Patient/Family education, Joint mobilization, Stair training, Aquatic Therapy, Dry Needling, Electrical stimulation, Cryotherapy, Moist heat, scar mobilization, Taping, Vasopneumatic device, and Manual therapy  PLAN FOR NEXT SESSION: cont balance with visual disturbance, gastroc endurance

## 2022-04-25 ENCOUNTER — Encounter (HOSPITAL_BASED_OUTPATIENT_CLINIC_OR_DEPARTMENT_OTHER): Payer: 59 | Admitting: Physical Therapy

## 2022-05-18 ENCOUNTER — Other Ambulatory Visit (HOSPITAL_BASED_OUTPATIENT_CLINIC_OR_DEPARTMENT_OTHER): Payer: Self-pay | Admitting: Orthopaedic Surgery

## 2022-05-18 DIAGNOSIS — M84376A Stress fracture, unspecified foot, initial encounter for fracture: Secondary | ICD-10-CM

## 2022-05-18 MED ORDER — VITAMIN D 25 MCG (1000 UNIT) PO TABS: 1000.0000 [IU] | ORAL_TABLET | Freq: Every day | ORAL | 3 refills | Status: DC

## 2022-05-18 MED ORDER — CALCIUM CARBONATE 1500 (600 CA) MG PO TABS: 1500.0000 mg | ORAL_TABLET | Freq: Every day | ORAL | 0 refills | Status: AC

## 2022-12-03 DIAGNOSIS — M216X2 Other acquired deformities of left foot: Secondary | ICD-10-CM

## 2022-12-13 ENCOUNTER — Encounter (HOSPITAL_BASED_OUTPATIENT_CLINIC_OR_DEPARTMENT_OTHER): Payer: Self-pay | Admitting: Physical Therapy

## 2023-01-17 ENCOUNTER — Ambulatory Visit (INDEPENDENT_AMBULATORY_CARE_PROVIDER_SITE_OTHER): Payer: BC Managed Care – PPO | Admitting: Sports Medicine

## 2023-01-17 ENCOUNTER — Encounter: Payer: Self-pay | Admitting: Sports Medicine

## 2023-01-17 DIAGNOSIS — S93691A Other sprain of right foot, initial encounter: Secondary | ICD-10-CM

## 2023-01-17 DIAGNOSIS — M25571 Pain in right ankle and joints of right foot: Secondary | ICD-10-CM

## 2023-01-17 NOTE — Progress Notes (Signed)
Lee Kelley - 22 y.o. male MRN [id number]  Date of birth: [birth year]  Office Visit Note: Visit Date: 01/17/2023 PCP: Pcp, No Referred by: No ref. provider found  Subjective: Chief Complaint  Patient presents with   Right Ankle - Follow-up   HPI: Lee Kelley is a pleasant 22 y.o. male who presents today for right foot pain with likely media band plantar fascia tear. He is a Armed forces technical officer.  "Lee Kelley" presents today for follow-up of right foot pain.  He had an initial injury about 1 month ago during a basketball game when he went to plant and pushoff of the foot and he felt and heard a loud audible pop at the bottom of his foot.  He initially had been immobilized shortly in a walking boot, placed into orthotics with partial weightbearing.  He has been progressing with his athletic trainer, Annie Main, and now is ambulating without difficulty.  He still has some pain with pushoff and running over the plantar aspect of the midfoot near the arch.   Pertinent surgical history on his ankle: Right ankle arthroscopy with Brostrm repair and peroneal tendon debridement on 02/05/2022.  Pertinent ROS were reviewed with the patient and found to be negative unless otherwise specified above in HPI.   Assessment & Plan: Visit Diagnoses:  1. Rupture of plantar fascia of right foot, initial encounter   2. Pain in joint involving right ankle and foot    Plan: Discussed with Lee Kelley his injury and examination is most indicative of the medial band rupture of the plantar fascia.  He is making good improvements about 1 month out from his injury, although has not yet returned to play.  We did do a trial of extracorporeal shockwave treatment therapy to help accelerate his healing.  I would like to have him return in 1 week and perform diagnostic ultrasound over the plantar fascia to ensure this is where his pathology is, lower suspicion for spring ligament but also will evaluate at that time.  Will likely proceed  with an additional shockwave treatment therapy and then see if this is helpful for continued treatment.  Would like him to continue rehab with his ATC, instructed to add towel scrunch, toe walks, etc. Will f/u in 1-week. Will hold from basketball play at this time.  Follow-up: Return in about 1 week (around 01/24/2023) for for [place] of right foot/PF (30-min).   Meds & Orders: No orders of the defined types were placed in this encounter.  No orders of the defined types were placed in this encounter.    Procedures: Procedure: ECSWT Indications:  Plantar fascia tear   Procedure Details Consent: Risks of procedure as well as the alternatives and risks of each were explained to the patient.  Verbal consent for procedure obtained. Time Out: Verified patient identification, verified procedure, site was marked, verified correct patient position. The area was cleaned with alcohol swab.     The right plantar fascia was targeted for Extracorporeal shockwave therapy.    Preset: Plantar Fasciitis Power Level: 90 mJ Frequency: 10 Hz Impulse/cycles: 2800 Head size: Regular   Patient tolerated procedure well without immediate complications.         Clinical History: No specialty comments available.  He reports that he has never smoked. He has never used smokeless tobacco. No results for input(s): "HGBA1C", "LABURIC" in the last 8760 hours.  Objective:    Physical Exam  Gen: Well-appearing, in no acute distress; non-toxic CV:  Well-perfused. Warm.  Resp:  Breathing unlabored on room air; no wheezing. Psych: Fluid speech in conversation; appropriate affect; normal thought process Neuro: Sensation intact throughout. No gross coordination deficits.   Ortho Exam - Right foot/ankle: Evaluation of the right foot and ankle demonstrates well-healed surgical incisions from prior peroneal tendon and Brostrm repair.  There is no swelling or effusion.  No longer any ecchymosis.  There is a mild TTP over  the plantar aspect of the midfoot near the medial band of the plantar fascia.  Patient is able to perform heel raises and toe raises without difficulty.  There is mild pes planovalgus of bilateral feet, although there is no loss of the arch unilaterally on the right side.  Imaging: No results found.  Past Medical/Family/Surgical/Social History: Medications & Allergies reviewed per EMR, new medications updated. Patient Active Problem List   Diagnosis Date Noted   Right ankle instability    No past medical history on file. No family history on file. Past Surgical History:  Procedure Laterality Date   ANKLE ARTHROSCOPY WITH REPAIR SUBLUXING TENDON Right 02/05/2022   Procedure: RIGHT ANKLE ARTHROSCOPY WITH BROSTOM REPAIR;  Surgeon: Vanetta Mulders, MD;  Location: [facility];  Service: Orthopedics;  Laterality: Right;   Social History   Occupational History   Not on file  Tobacco Use   Smoking status: Never   Smokeless tobacco: Never  Vaping Use   Vaping Use: Never used  Substance and Sexual Activity   Alcohol use: Never   Drug use: Never   Sexual activity: Not Currently

## 2023-01-24 ENCOUNTER — Ambulatory Visit (INDEPENDENT_AMBULATORY_CARE_PROVIDER_SITE_OTHER): Payer: BC Managed Care – PPO | Admitting: Sports Medicine

## 2023-01-24 ENCOUNTER — Other Ambulatory Visit: Payer: Self-pay

## 2023-01-24 ENCOUNTER — Encounter: Payer: Self-pay | Admitting: Sports Medicine

## 2023-01-24 DIAGNOSIS — S93691D Other sprain of right foot, subsequent encounter: Secondary | ICD-10-CM

## 2023-01-24 DIAGNOSIS — M25571 Pain in right ankle and joints of right foot: Secondary | ICD-10-CM

## 2023-01-24 NOTE — Progress Notes (Signed)
Lee Kelley - 22 y.o. male MRN [id number]  Date of birth: [birth year]  Office Visit Note: Visit Date: 01/24/2023 PCP: Pcp, No Referred by: No ref. provider found  Subjective: No chief complaint on file.  HPI: Lee Kelley is a pleasant 22 y.o. male who presents today for right plantar foot pain with suspected plantar fascial tear.  He is a Patent attorney.  He is about 5 weeks or so out from his initial injury when  during a basketball game when he went to plant and pushoff of the foot and he felt and heard a loud audible pop at the bottom of his foot.   His pain has been improving over this time.  He now is able to ambulate without any pain.  No pain with heel and calf raises.  Still does not feel like he has his explosiveness and is unable to run or burst quickly without some pain over the plantar aspect of the foot.  He is doing therapy with his ATC, Annie Main.  Pertinent ROS were reviewed with the patient and found to be negative unless otherwise specified above in HPI.   Assessment & Plan: Visit Diagnoses:  1. Rupture of plantar fascia of right foot, subsequent encounter   2. Pain in joint involving right ankle and foot    Plan: Discussed with "AK" that are ultrasound in his injury history does reveal he had a rupture of the medial band of the plantar fascia.  He still has intact plantar fascia at the insertion and attachment points.  Ultrasound does show interval healing of the fascia itself.  Reassuring that his posterior tibial tendon and spring ligament look intact.  He is making progress with therapy with his ATC, I would like him to continue this.  Specifically he will work on plantar fascia massage and towel scrunch exercises, single-leg heel raise, proprioceptive balancing.  He will start increasing his physical activity with elliptical use, I would like him to start doing some mild jump rope from about 10-20 reps at a time.  In terms of getting back into jumping, we  will let pain be his guide and he will do this at the overview of his athletic trainer, Annie Main.  We did do a second treatment of extracorporeal shockwave therapy to augment healing.  At this point I think he does not need any further shockwave sessions and will continue with rehab.  Would expect him to get back to full activity sometime over the next 3-5 weeks. He will f/u with me in [place] as needed.  Follow-up: Return if symptoms worsen or fail to improve.   Meds & Orders: No orders of the defined types were placed in this encounter.   Orders Placed This Encounter  Procedures   [place] Extrem Low Right Ltd     Procedures: Procedure: ECSWT Indications:  Plantar fascia tear   Procedure Details Consent: Risks of procedure as well as the alternatives and risks of each were explained to the patient.  Verbal consent for procedure obtained. Time Out: Verified patient identification, verified procedure, site was marked, verified correct patient position. The area was cleaned with alcohol swab.     The right plantar fascia was targeted for Extracorporeal shockwave therapy.    Preset: Plantar Fasciitis Power Level: 90 mJ Frequency: 10 Hz Impulse/cycles: 2500 Head size: Regular   Patient tolerated procedure well without immediate complications.      Clinical History: No specialty comments available.  He reports that he has never  smoked. He has never used smokeless tobacco. No results for input(s): "HGBA1C", "LABURIC" in the last 8760 hours.  Objective:    Physical Exam  Gen: Well-appearing, in no acute distress; non-toxic CV: Regular Rate. Well-perfused. Warm.  Resp: Breathing unlabored on room air; no wheezing. Psych: Fluid speech in conversation; appropriate affect; normal thought process Neuro: Sensation intact throughout. No gross coordination deficits.   Ortho Exam - Right foot: There are well-healed surgical incisions from prior peroneal tendon and Brostrm repair, no evidence of  infection.  Mild TTP over the mid belly of the medial plantar fascial band.  Patient is able to perform heel raises and toe raises without difficulty.  There is mild pes planovalgus, although corrected and his arch supports.  No redness, bruising.  Imaging: [place] Extrem Low Right Ltd  Result Date: 01/24/2023 Limited musculoskeletal ultrasound of the right lower extremity, right foot was performed today.  Evaluation of the calcaneus shows no cortical irregularity, there is proper insertion of the plantar fascia over the medial calcaneus without irregularity.  Scanning distally over the belly of the medial band of the plantar fascia shows disruption of the fascia indicative of a medial band rupture.  There is a mild hypoechoic fluid surrounding with evidence of interval healing.  This is visualized in both longitudinal and short axis views.  Scanning distally shows intact plantar fascia.  Evaluation of the spring ligament shows intact spring ligament without abnormal pathology.  Posterior tibial tendon is intact without evidence of tenosynovitis or tearing.         Past Medical/Family/Surgical/Social History: Medications & Allergies reviewed per EMR, new medications updated. Patient Active Problem List   Diagnosis Date Noted   Right ankle instability    History reviewed. No pertinent past medical history. History reviewed. No pertinent family history. Past Surgical History:  Procedure Laterality Date   ANKLE ARTHROSCOPY WITH REPAIR SUBLUXING TENDON Right 02/05/2022   Procedure: RIGHT ANKLE ARTHROSCOPY WITH BROSTOM REPAIR;  Surgeon: Vanetta Mulders, MD;  Location: [place];  Service: Orthopedics;  Laterality: Right;   Social History   Occupational History   Not on file  Tobacco Use   Smoking status: Never   Smokeless tobacco: Never  Vaping Use   Vaping Use: Never used  Substance and Sexual Activity   Alcohol use: Never   Drug use: Never   Sexual activity: Not Currently

## 2023-03-25 ENCOUNTER — Other Ambulatory Visit (HOSPITAL_BASED_OUTPATIENT_CLINIC_OR_DEPARTMENT_OTHER): Payer: Self-pay | Admitting: Orthopaedic Surgery

## 2023-03-25 DIAGNOSIS — M84376A Stress fracture, unspecified foot, initial encounter for fracture: Secondary | ICD-10-CM

## 2023-03-25 MED ORDER — VITAMIN D (ERGOCALCIFEROL) 1.25 MG (50000 UNIT) PO CAPS: 50000.0000 [IU] | ORAL_CAPSULE | ORAL | 0 refills | Status: AC

## 2023-03-25 MED ORDER — VITAMIN D 25 MCG (1000 UNIT) PO TABS: 1000.0000 [IU] | ORAL_TABLET | Freq: Every day | ORAL | 3 refills | Status: AC

## 2023-06-20 ENCOUNTER — Ambulatory Visit (HOSPITAL_BASED_OUTPATIENT_CLINIC_OR_DEPARTMENT_OTHER): Payer: No Typology Code available for payment source | Admitting: Orthopaedic Surgery

## 2023-06-20 DIAGNOSIS — M25572 Pain in left ankle and joints of left foot: Secondary | ICD-10-CM

## 2023-06-20 NOTE — Progress Notes (Signed)
Chief Complaint: Left ankle pain     History of Present Illness:    Lee Kelley is a 22 y.o. male presents today with ongoing left ankle pain and tightness for the last week.  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  He does also have a history of a navicular stress fracture which we treated last year.  At today's visit his issues predominantly tightness particular about the posterior ankle.   Surgical History:   none  PMH/PSH/Family History/Social History/Meds/Allergies:   No past medical history on file. Past Surgical History:  Procedure Laterality Date   ANKLE ARTHROSCOPY WITH REPAIR SUBLUXING TENDON Right 02/05/2022   Procedure: RIGHT ANKLE ARTHROSCOPY WITH BROSTOM REPAIR;  Surgeon: Huel Cote, MD;  Location: [facility];  Service: Orthopedics;  Laterality: Right;   Social History   Socioeconomic History   Marital status: Single    Spouse name: Not on file   Number of children: Not on file   Years of education: Not on file   Highest education level: Not on file  Occupational History   Not on file  Tobacco Use   Smoking status: Never   Smokeless tobacco: Never  Vaping Use   Vaping status: Never Used  Substance and Sexual Activity   Alcohol use: Never   Drug use: Never   Sexual activity: Not Currently  Other Topics Concern   Not on file  Social History Narrative   Not on file   Social Determinants of Health   Financial Resource Strain: Not on file  Food Insecurity: Not on file  Transportation Needs: Not on file  Physical Activity: Not on file  Stress: Not on file  Social Connections: Not on file   No family history on file. No Known Allergies Current Outpatient Medications  Medication Sig Dispense Refill   aspirin EC 325 MG tablet Take 1 tablet (325 mg total) by mouth daily. 30 tablet 0   calcium carbonate (OSCAL) 1500  (600 Ca) MG TABS tablet Take 1 tablet (1,500 mg total) by mouth daily with breakfast. 30 tablet 0   cholecalciferol (VITAMIN D3) 25 MCG (1000 UNIT) tablet Take 1 tablet (1,000 Units total) by mouth daily. 30 tablet 3   oxyCODONE (OXY IR/ROXICODONE) 5 MG immediate release tablet Take 1 tablet (5 mg total) by mouth every 4 (four) hours as needed (severe pain). 10 tablet 0   Vitamin D, Ergocalciferol, (DRISDOL) 1.25 MG (50000 UNIT) CAPS capsule Take 1 capsule (50,000 Units total) by mouth every 7 (seven) days. 2 capsule 0   No current facility-administered medications for this visit.   No results found.  Review of Systems:   A ROS was performed including pertinent positives and negatives as documented in the HPI.  Physical Exam :   Constitutional: NAD and appears stated age Neurological: Alert and oriented Psych: Appropriate affect and cooperative There were no vitals taken for this visit.   Comprehensive Musculoskeletal Exam:    Tenderness palpation about left Achilles with some tightness.  There is tightness with limited dorsiflexion.  No tenderness about the navicular.  Positive calcaneal tilt with some clicking  Imaging:   Xray (3 views left ankle): Large anterior tibia osteophyte as well as anterior talar osteophyte.  Evidence of old medial ossification consistent with instability injury   I personally reviewed and interpreted the radiographs.   Assessment:   22 y.o. male with left ankle instability as well as subsequent posttraumatic impingement.  I did describe that overall I believe that this is resulting in his inability to stretch out the Achilles.  This is resulted in some Achilles symptoms.  I did offer him an ultrasound-guided injection of the ankle joint which she would like to defer today.  Plan :    -Return to clinic as needed     I personally saw and evaluated the patient, and participated in the management and treatment plan.  Huel Cote, MD Attending  Physician, Orthopedic Surgery  This document was dictated using Dragon voice recognition software. A reasonable attempt at proof reading has been made to minimize errors.

## 2023-07-29 ENCOUNTER — Ambulatory Visit (INDEPENDENT_AMBULATORY_CARE_PROVIDER_SITE_OTHER): Payer: No Typology Code available for payment source | Admitting: Podiatry

## 2023-07-29 ENCOUNTER — Ambulatory Visit: Payer: PRIVATE HEALTH INSURANCE

## 2023-07-29 DIAGNOSIS — S92255S Nondisplaced fracture of navicular [scaphoid] of left foot, sequela: Secondary | ICD-10-CM | POA: Diagnosis not present

## 2023-07-29 DIAGNOSIS — M216X2 Other acquired deformities of left foot: Secondary | ICD-10-CM | POA: Diagnosis not present

## 2023-07-29 DIAGNOSIS — S92254S Nondisplaced fracture of navicular [scaphoid] of right foot, sequela: Secondary | ICD-10-CM | POA: Diagnosis not present

## 2023-07-29 DIAGNOSIS — M216X1 Other acquired deformities of right foot: Secondary | ICD-10-CM

## 2023-07-29 DIAGNOSIS — Z87312 Personal history of (healed) stress fracture: Secondary | ICD-10-CM

## 2023-07-29 NOTE — Progress Notes (Unsigned)
Patient was seen, measured for custom molded foot orthotics  Patient will benefit from CFO's as they will help provide total contact to MLA's helping to better distribute body weight across BIL feet greater reducing plantar pressure and pain and to also encourage FF and RF alignment  Patient was scanned items to be ordered and fit when in  [employer], CFo, CFm

## 2023-07-30 NOTE — Progress Notes (Signed)
Subjective:   Patient ID: Lee Kelley, male   DOB: 22 y.o.   MRN: [id number]   HPI Chief Complaint  Patient presents with   Foot Orthotics    Pt stated he wanted to get new orthotics     22 year old male presents the office today for orthotics.  She states orthotics are worn out completely new pair.  He gets this, on the Achilles tendon she is being currently treated by his trainers at [facility] for this.  He had a history of navicular stress fracture.  He had been doing well no significant pain or swelling or any recent injuries.       Objective:  Physical Exam  General: AAO x3, NAD  Dermatological: Skin is warm, dry and supple bilateral.  There are no open sores, no preulcerative lesions, no rash or signs of infection present.  Vascular: Dorsalis Pedis artery and Posterior Tibial artery pedal pulses are 2/4 bilateral with immedate capillary fill time.  There is no pain with calf compression, swelling, warmth, erythema.   Neruologic: Grossly intact via light touch bilateral.   Musculoskeletal: Cannot elicit any area of pinpoint tenderness.  Decreased medial arch upon weightbearing.  Ankle, subtalar joint range of motion intact but any restrictions.  Lee Kelley is present muscular strength 5/5 in all groups tested bilateral.  Gait: Unassisted, Nonantalgic.       Assessment:   Flatfoot, history of navicular stress fractures     Plan:  We discussed orthotics and he wants to proceed with this.  He was measured for new inserts today.  Continue stretching exercises regularly continue to follow with the trainers at [facility] as well.   Lee Kelley DPM

## 2023-09-11 ENCOUNTER — Other Ambulatory Visit: Payer: PRIVATE HEALTH INSURANCE

## 2023-09-19 ENCOUNTER — Ambulatory Visit: Payer: PRIVATE HEALTH INSURANCE

## 2023-09-19 NOTE — Progress Notes (Signed)
Patient presents today to pick up custom molded foot orthotics, diagnosed with Stress Fracture and pain by Dr. Ardelle Anton.   Orthotics were dispensed and fit was satisfactory. Reviewed instructions for break-in and wear. Written instructions given to patient.  Patient will follow up as needed.   Addison Bailey Cped, CFo, CFm

## 2023-11-15 ENCOUNTER — Encounter: Payer: Self-pay | Admitting: Sports Medicine

## 2023-11-18 ENCOUNTER — Other Ambulatory Visit: Payer: Self-pay

## 2023-11-18 ENCOUNTER — Ambulatory Visit (INDEPENDENT_AMBULATORY_CARE_PROVIDER_SITE_OTHER): Payer: No Typology Code available for payment source | Admitting: Sports Medicine

## 2023-11-18 ENCOUNTER — Encounter: Payer: Self-pay | Admitting: Sports Medicine

## 2023-11-18 DIAGNOSIS — M79674 Pain in right toe(s): Secondary | ICD-10-CM

## 2023-11-18 DIAGNOSIS — M7989 Other specified soft tissue disorders: Secondary | ICD-10-CM

## 2023-11-18 NOTE — Progress Notes (Signed)
 Lee Kelley - 23 y.o. male MRN [id number]  Date of birth: 07-Aug-2001  Office Visit Note: Visit Date: 11/18/2023 PCP: Pcp, No Referred by: No ref. provider found  Subjective: Chief Complaint  Patient presents with   Right Foot - Pain   HPI: Lee Kelley is a pleasant 23 y.o. male who presents today for right great toe pain and swelling. He is a Psychologist, Educational.  AK had sudden onset of right great toe, specifically at the MTP joint, pain and swelling.  This started this past Thursday, and actually woke him up Thursday night into Friday morning because of the pain.  I did see him in the training room at half-time of the women's basketball game and noted an effusion of the first MTP joint with a degree of redness in the soft tissue.  We did place him on a Medrol  Dosepak as well as Bactrim  DS BID which she has been taking.  He took his first dose of both Friday night and reports by Saturday morning he had about 50% reduction in his pain.  His pain and swelling has continued to improve, he has not had any redness or warmth in the joint since Saturday evening.  Has not yet got back in the basketball to try running, but he has no pain with standing or basic ambulation.  Feels like he is at least 85% improved.  He has not nor had he had any fevers or signs of systemic infection.  No history of inflammatory arthropathy, personal history or known family history of gout.  Pertinent ROS were reviewed with the patient and found to be negative unless otherwise specified above in HPI.   Assessment & Plan: Visit Diagnoses:  1. Swelling of toe of right foot   2. Great toe pain, right    Plan: AK had insidious, sudden onset of pain, swelling and redness of the MTP joint of the great toe -this has responded well to combination Medrol  Dosepak as well as Bactrim  twice daily.  Differential diagnosis includes MTP synovitis, gout, infectious etiology or other inflammatory arthropathy.  Given his  50% reduction in pain and swelling even after the first dose of prednisone, I do favor inflammatory based etiology.  In general he is significantly improved.  We will obtain labs including CBC, CRP and uric acid to help define etiology.  He will continue both Medrol  Dosepak and the Bactrim  at this time, lower suspicion for infectious etiology given improvement, if his labs are nonconcerning for infectious etiology, may consider reducing his Bactrim  to 7 days as opposed to 10 days.  He will continue his plan for now and I will update him/athletic trainer, Garnette, once labs return.  We discussed returning to some stationary bicycle and standing shooting drills today and may progress to return to practice over the next few days with his ATC as pain allows.  Follow-up: Return if symptoms worsen or fail to improve.   Meds & Orders: No orders of the defined types were placed in this encounter.   Orders Placed This Encounter  Procedures   US  Extrem Low Right Ltd   Uric acid   C-reactive protein   CBC with Differential     Procedures: No procedures performed      Clinical History: No specialty comments available.  He reports that he has never smoked. He has never used smokeless tobacco. No results for input(s): HGBA1C, LABURIC in the last 8760 hours.  Objective:    Physical Exam  Gen: Well-appearing, in no acute distress; non-toxic CV: Well-perfused. Warm.  Resp: Breathing unlabored on room air; no wheezing. Psych: Fluid speech in conversation; appropriate affect; normal thought process  Ortho Exam - Right great toe: Very mild TTP with deep palpation of the great toe MTP joint.  There is no overlying redness or warmth of the joint today, which is significantly improved from prior examination on 1/3.  There is a very trace effusion compared to the contralateral side.  There is full range of motion with very mild discomfort at end range extension.  Imaging: US  Extrem Low Right  Ltd Result Date: 11/18/2023 Limited musculoskeletal ultrasound of the lower extremity, right great toe was ordered and reviewed by myself today.  Evaluation of the proximal phalanx and metatarsal shows no cortical regularity.  Evaluation of the first MTP joint shows a trace to small effusion without significant hyperemia or Doppler uptake.  The contralateral left great toe has no joint effusion.  The overlying extensor tendon of the right great first toe was seen in long axis without evidence of tearing or tendon abnormality.   Trace effusion of the MTP joint of the right great toe (which was significantly reduced from prior effusion on POCUS on 11/15/23)   Past Medical/Family/Surgical/Social History: Medications & Allergies reviewed per EMR, new medications updated. Patient Active Problem List   Diagnosis Date Noted   Right ankle instability    History reviewed. No pertinent past medical history. History reviewed. No pertinent family history. Past Surgical History:  Procedure Laterality Date   ANKLE ARTHROSCOPY WITH REPAIR SUBLUXING TENDON Right 02/05/2022   Procedure: RIGHT ANKLE ARTHROSCOPY WITH BROSTOM REPAIR;  Surgeon: Genelle Standing, MD;  [facility]  [facility];  Service: Orthopedics;  Laterality: Right;   Social History   Occupational History   Not on file  Tobacco Use   Smoking status: Never   Smokeless tobacco: Never  Vaping Use   Vaping status: Never Used  Substance and Sexual Activity   Alcohol use: Never   Drug use: Never   Sexual activity: Not Currently

## 2023-11-20 LAB — CBC WITH DIFFERENTIAL/PLATELET
Absolute Lymphocytes: 655 {cells}/uL — ABNORMAL LOW (ref 850–3900)
Absolute Monocytes: 676 {cells}/uL (ref 200–950)
Basophils Absolute: 52 {cells}/uL (ref 0–200)
Basophils Relative: 0.5 %
Eosinophils Absolute: 42 {cells}/uL (ref 15–500)
Eosinophils Relative: 0.4 %
HCT: 46 % (ref 38.5–50.0)
Hemoglobin: 15.3 g/dL (ref 13.2–17.1)
MCH: 30.1 pg (ref 27.0–33.0)
MCHC: 33.3 g/dL (ref 32.0–36.0)
MCV: 90.6 fL (ref 80.0–100.0)
MPV: 12 fL (ref 7.5–12.5)
Monocytes Relative: 6.5 %
Neutro Abs: 8975 {cells}/uL — ABNORMAL HIGH (ref 1500–7800)
Neutrophils Relative %: 86.3 %
Platelets: 221 10*3/uL (ref 140–400)
RBC: 5.08 10*6/uL (ref 4.20–5.80)
RDW: 12.6 % (ref 11.0–15.0)
Total Lymphocyte: 6.3 %
WBC: 10.4 10*3/uL (ref 3.8–10.8)

## 2023-11-20 LAB — URIC ACID: Uric Acid, Serum: 7.3 mg/dL (ref 4.0–8.0)

## 2023-11-20 LAB — C-REACTIVE PROTEIN: CRP: 7 mg/L (ref <8.0)

## 2023-11-26 ENCOUNTER — Encounter: Payer: Self-pay | Admitting: Sports Medicine

## 2023-11-26 MED ORDER — METHYLPREDNISOLONE 4 MG PO TBPK: ORAL_TABLET | ORAL | 0 refills | Status: DC

## 2023-11-27 ENCOUNTER — Other Ambulatory Visit: Payer: Self-pay | Admitting: Sports Medicine

## 2023-11-27 MED ORDER — METHYLPREDNISOLONE 4 MG PO TBPK: ORAL_TABLET | ORAL | 0 refills | Status: DC

## 2023-12-02 ENCOUNTER — Encounter: Payer: Self-pay | Admitting: Sports Medicine

## 2023-12-02 MED ORDER — SULFAMETHOXAZOLE-TRIMETHOPRIM 800-160 MG PO TABS: 1.0000 | ORAL_TABLET | Freq: Two times a day (BID) | ORAL | 0 refills | Status: AC

## 2023-12-05 ENCOUNTER — Encounter: Payer: Self-pay | Admitting: Sports Medicine

## 2023-12-06 ENCOUNTER — Encounter: Payer: Self-pay | Admitting: Sports Medicine

## 2023-12-06 NOTE — Progress Notes (Signed)
  [employer] Training Room Note  History of Present Illness:   Lee Kelley is a 23 y.o. male who follows up for right great toe swelling and pain. He is a Psychologist, educational.  -Had sudden onset of right great toe pain swelling and redness back the first week in January.  We did treat him at that time with a Medrol Dosepak x 6 days and Bactrim DS twice daily x 7 days which resolved his pain quickly. -Unfortunately his pain did return as well as some swelling in the big toe which responded initially to prednisone. -I did have a conversation with him and his athletic trainer, [name], about deciphering whether this was inflammatory or infectious based.  I did send in a prescription for Bactrim to be taken twice daily starting on 12/02/2023 in the PM.  Unfortunately, there was a another prescription for methylprednisolone so he did take 2 days of this.    His last dose was in the a.m. of 12/05/2023 for the methylprednisolone.  Assessment and Plan:   Great toe pain and swelling -concern for either gout/inflammatory arthropathy versus big toe/joint infection.  Responded initially quite quickly to both Bactrim and methylprednisolone.  Discussed needing to decipher which pathology is causing the pain.  Reiterated the importance of staying off the prednisone and all anti-inflammatories and he will continue his Bactrim twice daily.  Would like to see over the next 4-5 days which direction his pain and swelling goes which will help rule in or out infectious versus inflammatory.  He is understanding of this and he will keep me and his trainer updated. *Could consider MRI if not responding to rule out osteo, but low-likelihood at this point.  Comprehensive Musculoskeletal Exam:    Right foot - great toe: There is a small effusion on the right toe with mild warmth but no surrounding roundness or overlying skin cellulitis.  There is mild pain with endrange flexion and extension, nontender to touch.  Imaging:     POCUS: Mild effusion of the right great toe compared to no effusion of the left contralateral great toe.  Madelyn Brunner, DO Primary Care Sports Medicine - [employer] Team Physician [facility]  This document was dictated using Dragon voice recognition software. A reasonable attempt at proof reading has been made to minimize errors.

## 2023-12-12 ENCOUNTER — Encounter: Payer: Self-pay | Admitting: Sports Medicine

## 2023-12-12 ENCOUNTER — Other Ambulatory Visit: Payer: Self-pay | Admitting: Sports Medicine

## 2023-12-12 DIAGNOSIS — M7989 Other specified soft tissue disorders: Secondary | ICD-10-CM

## 2023-12-12 DIAGNOSIS — M25476 Effusion, unspecified foot: Secondary | ICD-10-CM

## 2023-12-12 DIAGNOSIS — M79674 Pain in right toe(s): Secondary | ICD-10-CM

## 2023-12-12 NOTE — Progress Notes (Signed)
  [employer] Training Room Note  History of Present Illness:   Lee Kelley is a 23 y.o. male who presents for follow-up of right great toe swelling and pain.  He is a Psychologist, educational.  "AK" has been having relapsing and remitting swelling and pain in the first MTP joint since early January.  Initially he was traveling so we placed him on both a 6-day Medrol Dosepak and Bactrim twice daily for 7 days which quickly resolved his pain.  This has returned which he treated with over-the-counter anti-inflammatories.  I did see him in the training room on 12/06/2023 and we will move forward with Bactrim double strength twice daily but was told to hold from all NSAIDs and any prednisone prescription, his last dose of methylprednisolone (and all NSAIDs) was the AM of 12/04/22.  He continues on his Bactrim course, his big toe is still doing well but has some mild swelling and pain only with end range of motion.  He still is able to play basketball.   Assessment and Plan:   Great toe (MTP) joint pain and swelling - POCUS shows resolving but still trace effusion of the MTP joint.  There is no surrounding redness.  Range of motion is full but mild pain with end range extension of the MTP joint.  Given his improvement from Bactrim antibiotic course only, this does point to be more infectious in nature.  Given his ongoing pain however and chronicity, I think we need to obtain a stat MRI to rule out any signs or symptoms of osteomyelitis and first MTP joint abnormality.  He will continue his Bactrim course for 14 days until completion.  I would like him to hold on anti-inflammatory medications until his MRI returns. May use ice as needed.  Comprehensive Musculoskeletal Exam:    Right great toe: Trace effusion with swelling around the right MTP joint.  Full range of motion but pain with endrange extension.  There is no surrounding cellulitis.  Imaging:    POCUS: Trace to Mild effusion of the right great toe  compared to no effusion of the left contralateral great toe.   Madelyn Brunner, DO Primary Care Sports Medicine - [employer] Team Physician [facility]  This document was dictated using Dragon voice recognition software. A reasonable attempt at proof reading has been made to minimize errors.

## 2023-12-15 ENCOUNTER — Ambulatory Visit (HOSPITAL_COMMUNITY): Admission: RE | Admit: 2023-12-15 | Payer: No Typology Code available for payment source | Source: Ambulatory Visit

## 2023-12-19 ENCOUNTER — Ambulatory Visit (HOSPITAL_COMMUNITY)
Admission: RE | Admit: 2023-12-19 | Discharge: 2023-12-19 | Disposition: A | Discharge status: Home / Self Care | Payer: No Typology Code available for payment source | Source: Ambulatory Visit | Attending: Sports Medicine | Admitting: Sports Medicine

## 2023-12-19 DIAGNOSIS — M7989 Other specified soft tissue disorders: Secondary | ICD-10-CM | POA: Diagnosis present

## 2023-12-19 DIAGNOSIS — M25476 Effusion, unspecified foot: Secondary | ICD-10-CM | POA: Insufficient documentation

## 2023-12-19 DIAGNOSIS — M79674 Pain in right toe(s): Secondary | ICD-10-CM | POA: Insufficient documentation

## 2023-12-24 ENCOUNTER — Encounter: Payer: Self-pay | Admitting: Sports Medicine

## 2023-12-24 MED ORDER — MELOXICAM 15 MG PO TABS: 15.0000 mg | ORAL_TABLET | Freq: Every day | ORAL | 0 refills | Status: DC

## 2023-12-24 MED ORDER — MELOXICAM 15 MG PO TABS: 15.0000 mg | ORAL_TABLET | Freq: Every day | ORAL | 0 refills | Status: AC
